# Patient Record
Sex: Female | Born: 1985 | Race: White | Hispanic: Yes | Smoking: Never smoker
Health system: Southern US, Community
[De-identification: ages and names within clinical notes are randomized; demographics above are authoritative.]

## PROBLEM LIST (undated history)

## (undated) DIAGNOSIS — G43909 Migraine, unspecified, not intractable, without status migrainosus: Secondary | ICD-10-CM

## (undated) DIAGNOSIS — R635 Abnormal weight gain: Secondary | ICD-10-CM

## (undated) HISTORY — PX: OTHER SURGICAL HISTORY: SHX169

## (undated) HISTORY — DX: Abnormal weight gain: R63.5

## (undated) HISTORY — DX: Migraine, unspecified, not intractable, without status migrainosus: G43.909

## (undated) HISTORY — PX: NO PAST SURGERIES: SHX2092

---

## 2012-09-25 ENCOUNTER — Ambulatory Visit: Payer: Self-pay | Admitting: Family Medicine

## 2012-12-18 ENCOUNTER — Ambulatory Visit: Payer: Self-pay | Admitting: Advanced Practice Midwife

## 2013-05-11 ENCOUNTER — Inpatient Hospital Stay: Payer: Self-pay | Admitting: Obstetrics and Gynecology

## 2013-05-11 LAB — CBC WITH DIFFERENTIAL/PLATELET
BASOS PCT: 0.4 %
Basophil #: 0.1 10*3/uL (ref 0.0–0.1)
Eosinophil #: 0.2 10*3/uL (ref 0.0–0.7)
Eosinophil %: 1.5 %
HCT: 36.3 % (ref 35.0–47.0)
HGB: 12.1 g/dL (ref 12.0–16.0)
Lymphocyte #: 2.9 10*3/uL (ref 1.0–3.6)
Lymphocyte %: 24.8 %
MCH: 30.1 pg (ref 26.0–34.0)
MCHC: 33.3 g/dL (ref 32.0–36.0)
MCV: 90 fL (ref 80–100)
MONO ABS: 1.3 x10 3/mm — AB (ref 0.2–0.9)
MONOS PCT: 10.7 %
Neutrophil #: 7.3 10*3/uL — ABNORMAL HIGH (ref 1.4–6.5)
Neutrophil %: 62.6 %
Platelet: 217 10*3/uL (ref 150–440)
RBC: 4.03 10*6/uL (ref 3.80–5.20)
RDW: 13.4 % (ref 11.5–14.5)
WBC: 11.7 10*3/uL — AB (ref 3.6–11.0)

## 2013-05-14 LAB — HEMATOCRIT: HCT: 27.8 % — AB (ref 35.0–47.0)

## 2014-05-17 NOTE — H&P (Signed)
L&D Evaluation:  History:  HPI 29 yo G1P0 at 40.[redacted] weeks gestation presents for Decreased FM.   Presents with decreased fetal movement   Patient's Medical History No Chronic Illness  H/.O Chlamydia, treated; H/O UTI, treated   Patient's Surgical History none   Medications Pre Natal Vitamins   Allergies NKDA   Social History none   Family History Non-Contributory   ROS:  ROS All systems were reviewed.  HEENT, CNS, GI, GU, Respiratory, CV, Renal and Musculoskeletal systems were found to be normal., except decreased FM   Exam:  Vital Signs stable   Urine Protein not completed   General no apparent distress   Mental Status clear   Heart normal sinus rhythm   Abdomen gravid, non-tender   Estimated Fetal Weight Average for gestational age, 7#4   Fetal Position VTX   Back no CVAT   Edema no edema   Pelvic no external lesions, FT/25/-3/vtx/soft   Mebranes Intact   FHT normal rate with no decels   FHT Description Reactive NST   Skin dry   Lymph no lymphadenopathy   Other O + ATB-/NR/RI/HB-/HIV-/GBS-/Glucola 137/ 3Hr GTT Normal   Impression:  Impression Reactive NST   Plan:  Plan discharge, IOL 05/12/2013   Electronic Signatures: Tyquarius Paglia, Prentice DockerMartin A (MD)  (Signed 05-May-15 14:17)  Authored: L&D Evaluation   Last Updated: 05-May-15 14:17 by Jaidyn Kuhl, Prentice DockerMartin A (MD)

## 2014-11-04 IMAGING — US US OB US >=[ID] SNGL FETUS
1 series · 13 of 28 positions shown · non-contrast
Comparison: none

CLINICAL DATA: Low Risk at anatomic scan.

EXAM:
ULTRASOUND OB >=L5HGX SINGLE FETUS

[Series 1: us ob us >=(id) sngl fetus · 0.26mm/px · 13 of 72 slices shown]
[im 3/72]
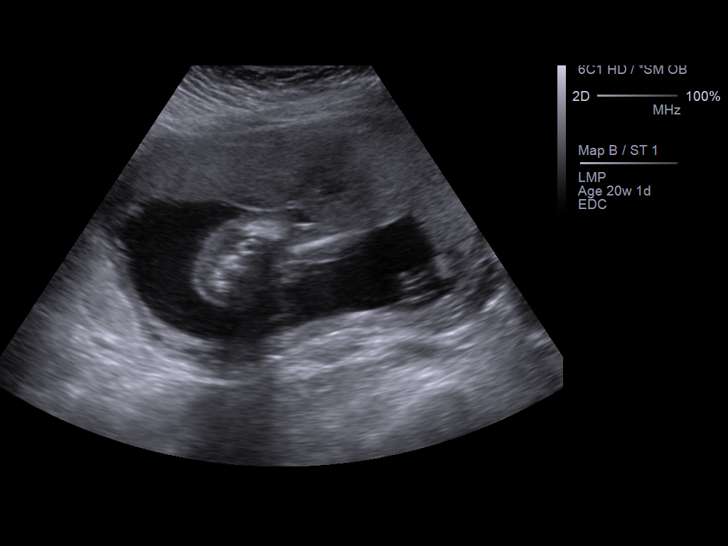
[im 8/72]
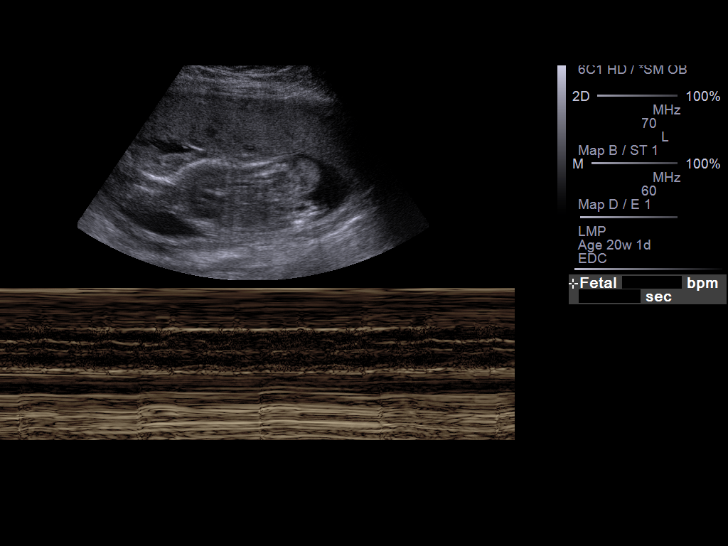
[im 14/72]
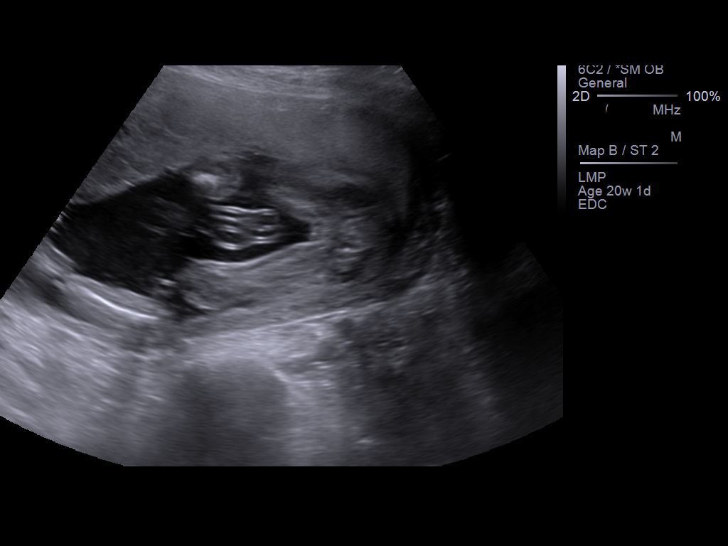
[im 19/72]
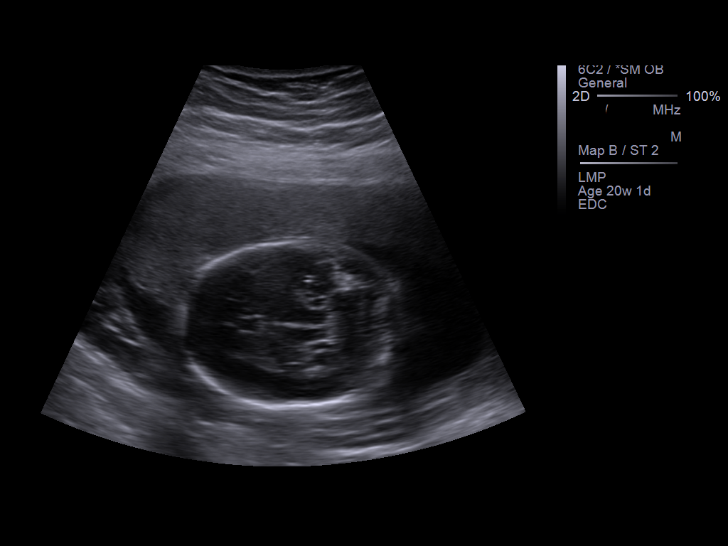
[im 24/72]
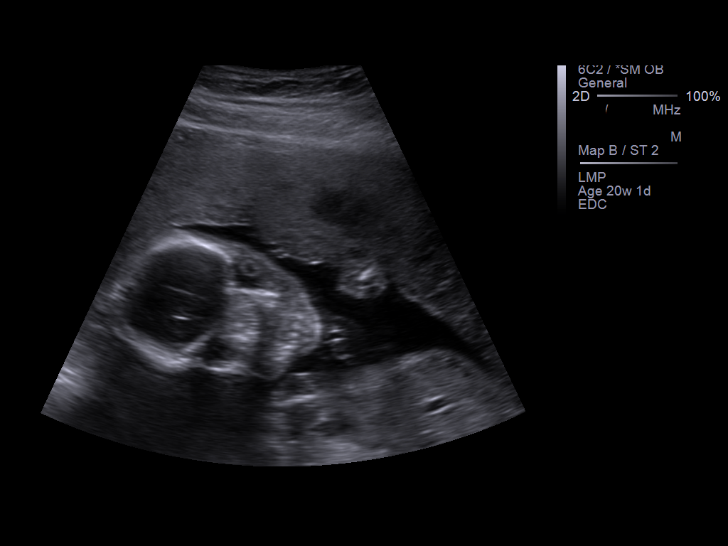
[im 29/72]
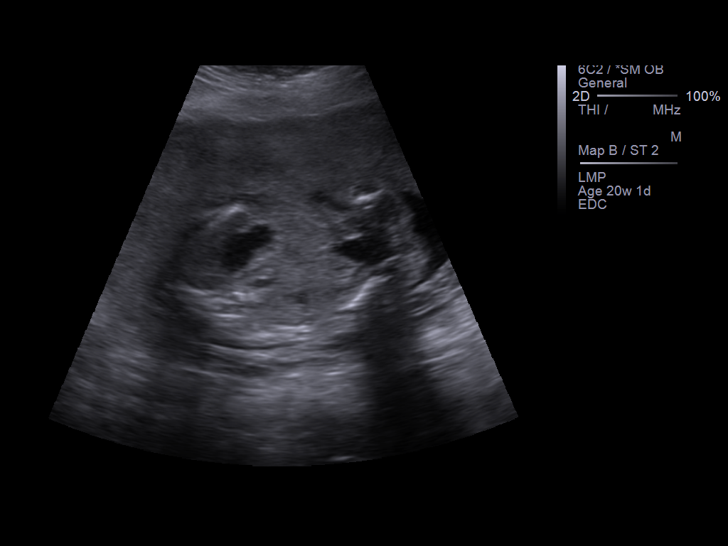
[im 37/72]
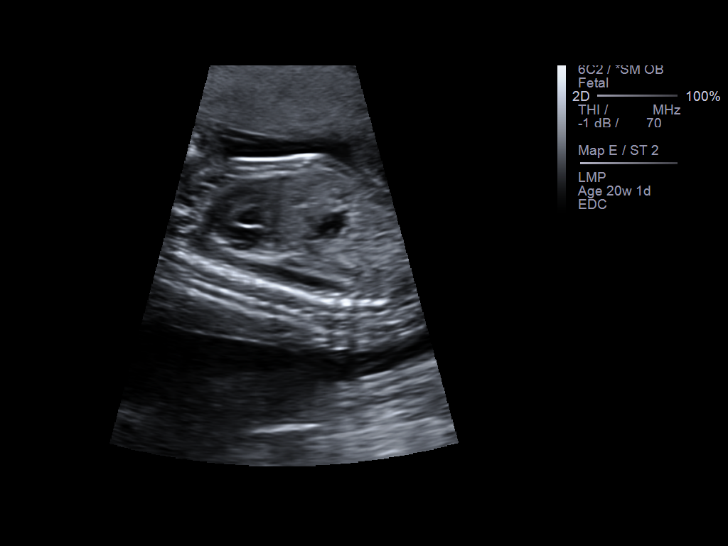
[im 43/72]
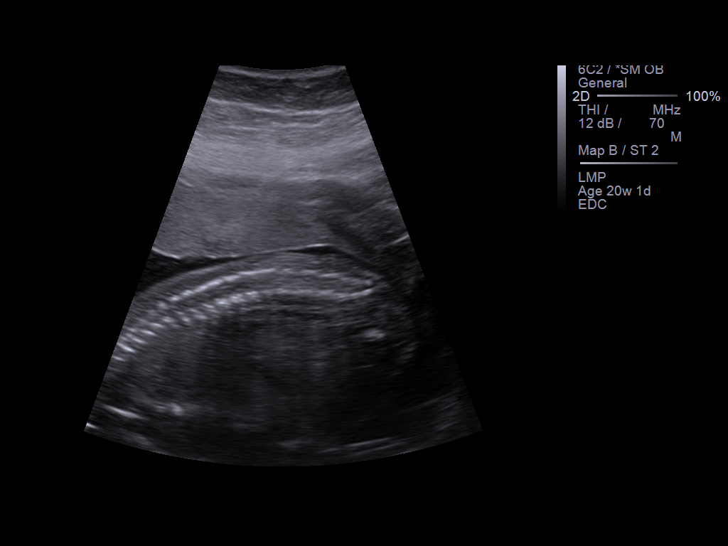
[im 48/72]
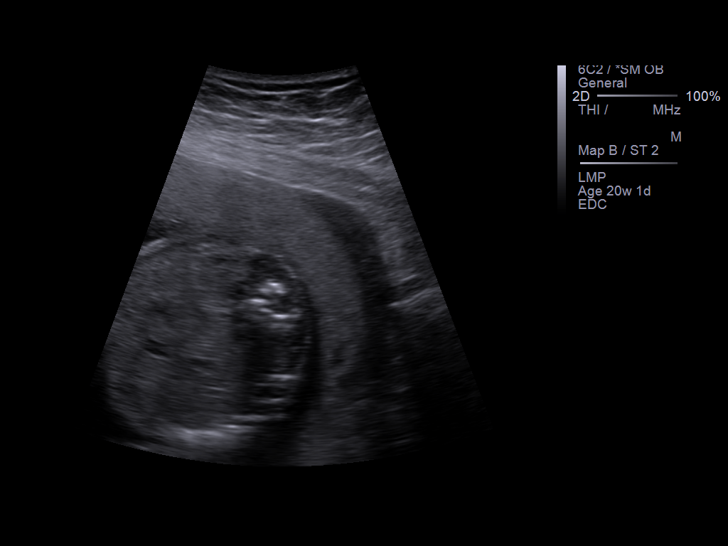
[im 53/72]
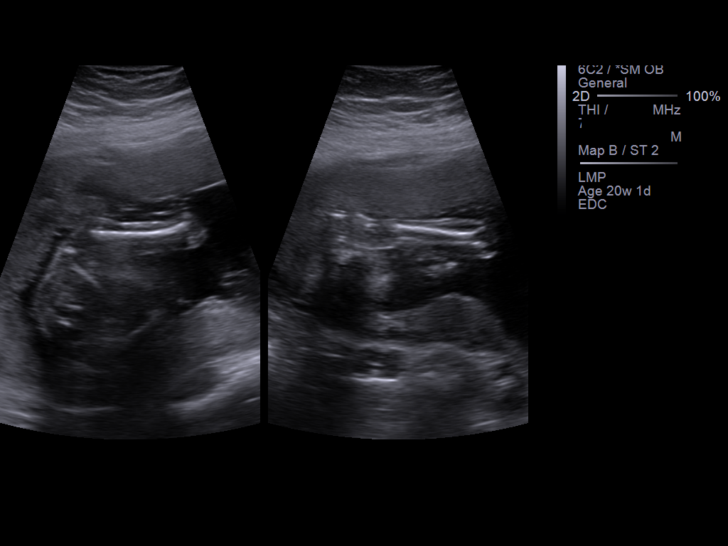
[im 58/72]
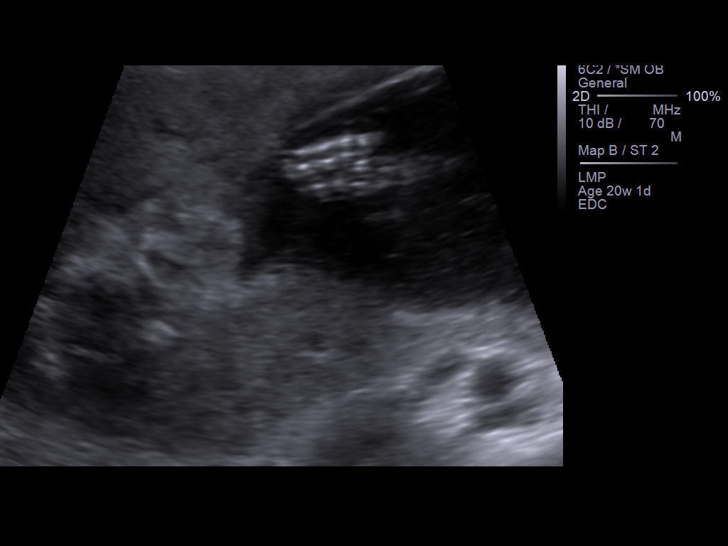
[im 64/72]
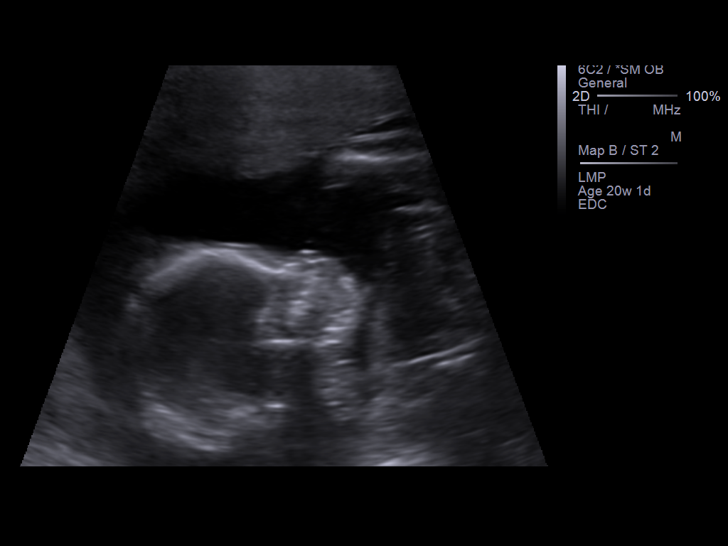
[im 69/72]
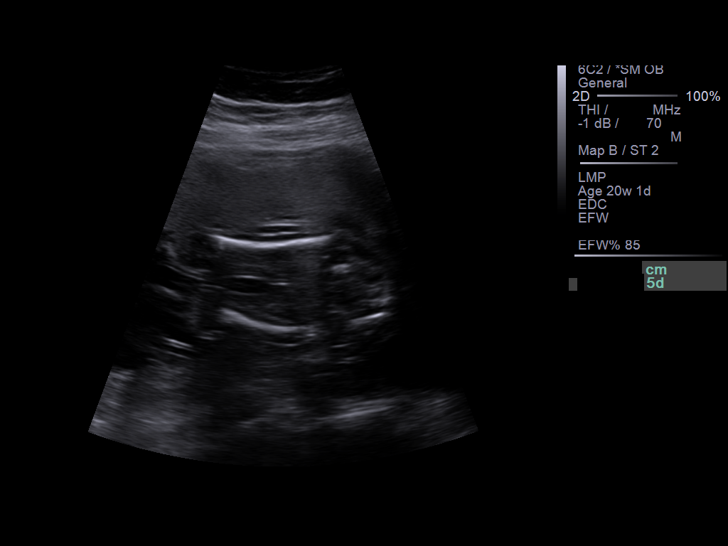

[13 of 28 positions shown; findings below may reference images not displayed]

FINDINGS: Number of Fetuses: 1

Heart Rate:  150 bpm

Movement: Present.

Presentation: Variable

Placental Location: Anterior and partially fundal

Previa: There is no evidence of placenta previa. The distance from
the lower placental segment to the internal cervical os is 4.7 cm

Amniotic Fluid (Subjective): Normal

FETAL BIOMETRY

BPD:  4.59cm 19.W   6d

HC:    17.74cm  20w   2d

AC:   16.13cm  21.W   2d

FL:   3.41cm  20.W   5d

Current Mean GA: 20w 4d                       US EDC: May 03, 2013

Assigned GA:  20w 1d              Assigned EDC:  May 06, 2013

FETAL ANATOMY

Lateral Ventricles: Normal.

Thalami/CSP: Visualized

Posterior Fossa:   Normal

Spine:  Normal

4 Chamber Heart on Left: Gas

Stomach on Left: Yes

3 Vessel Cord: Normal

Cord Insertion site: Normal

Kidneys:  Yes

Bladder:  Yes

Extremities:  Normal where visualized

Maternal Findings:

Cervix:  Closed  cm
IMPRESSION: There is a single viable IUP with variable presentation with
estimated gestational age of 20 weeks 4 days. The estimated date of
confinement is May 03, 2013 which is in reasonable remote with the
patient's clinical dating. No fetal anomalies were demonstrated.

## 2015-07-31 LAB — HM PAP SMEAR: HM Pap smear: NEGATIVE

## 2017-08-04 LAB — HM HIV SCREENING LAB: HM HIV Screening: NEGATIVE

## 2019-09-21 ENCOUNTER — Encounter: Payer: Self-pay | Admitting: Advanced Practice Midwife

## 2019-09-21 ENCOUNTER — Other Ambulatory Visit: Payer: Self-pay

## 2019-09-21 ENCOUNTER — Ambulatory Visit (LOCAL_COMMUNITY_HEALTH_CENTER): Payer: Self-pay | Admitting: Advanced Practice Midwife

## 2019-09-21 VITALS — BP 103/61 | HR 70 | Temp 97.1°F | Ht 61.0 in | Wt 162.0 lb

## 2019-09-21 DIAGNOSIS — E282 Polycystic ovarian syndrome: Secondary | ICD-10-CM

## 2019-09-21 DIAGNOSIS — Z30011 Encounter for initial prescription of contraceptive pills: Secondary | ICD-10-CM

## 2019-09-21 DIAGNOSIS — E669 Obesity, unspecified: Secondary | ICD-10-CM

## 2019-09-21 DIAGNOSIS — Z3009 Encounter for other general counseling and advice on contraception: Secondary | ICD-10-CM

## 2019-09-21 LAB — WET PREP FOR TRICH, YEAST, CLUE
Trichomonas Exam: NEGATIVE
Yeast Exam: NEGATIVE

## 2019-09-21 LAB — PREGNANCY, URINE: Preg Test, Ur: NEGATIVE

## 2019-09-21 MED ORDER — NORGESTIM-ETH ESTRAD TRIPHASIC 0.18/0.215/0.25 MG-25 MCG PO TABS: 1.0000 | ORAL_TABLET | Freq: Every day | ORAL | 13 refills | Status: DC

## 2019-09-21 NOTE — Progress Notes (Signed)
Only three packs of Tri-Lo-Sprintec dispensed as all available in [facility] (e-mail sent to S. Walker RPh regarding supply). Client counseled to call for Nurse Clinic appt for additional pills when last pack of ocps opened. UPT negative and wet mount negative. Jossie Ng, RN

## 2019-09-21 NOTE — Progress Notes (Signed)
[facility] [address] Main Number: [phone]    Family Planning Visit- Initial Visit  Subjective:  Aanshi Batchelder is a 34 y.o. MHF G1P1000 (34 yo daughter) nonsmoker  being seen today for an initial well woman visit and to discuss family planning options.  She is currently using None for pregnancy prevention. Patient reports she does not want a pregnancy in the next year.  Patient has the following medical conditions does not have a problem list on file.  Chief Complaint  Patient presents with  . Annual Exam    Patient reports LMP 09/11/19.  Last sex 09/18/19 without condom; with current partner x 8 years; one sex partner in last 3 mo.  Unemployed.  Living with husband and child.  Last ETOH 09/11/19 (2 mixed drinks) on special occassions. Last pap 07/31/15 neg no HPV done.  Patient denies cigs, MJ.     Body mass index is 30.61 kg/m. - Patient is eligible for diabetes screening based on BMI and age >47?  not applicable HA1C ordered? not applicable  Patient reports 1 of partners in last year. Desires STI screening?  Yes  Has patient been screened once for HCV in the past?  No  No results found for: HCVAB  Does the patient have current drug use (including MJ), have a partner with drug use, and/or has been incarcerated since last result? No  If yes-- Screen for HCV through [facility]   Does the patient meet criteria for HBV testing? No  Criteria:  -Household, sexual or needle sharing contact with HBV -History of drug use -HIV positive -Those with known Hep C   Health Maintenance Due  Topic Date Due  . Hepatitis C Screening  Never done  . TETANUS/TDAP  Never done  . PAP SMEAR-Modifier  07/31/2018  . INFLUENZA VACCINE  Never done    Review of Systems  Constitutional: Positive for weight loss (wt gain but states has lost 20 lbs since 07/2019).  Neurological: Positive for headaches (1-3x/wk always occipital and  frontal relieved with Tylenol or ASA, -N&V,,-vision,-audio,-neuro).  All other systems reviewed and are negative.   The following portions of the patient's history were reviewed and updated as appropriate: allergies, current medications, past family history, past medical history, past social history, past surgical history and problem list. Problem list updated.   See flowsheet for other program required questions.  Objective:   Vitals:   09/21/19 0848  BP: 103/61  Pulse: 70  Temp: (!) 97.1 F (36.2 C)  TempSrc: Oral  Weight: 162 lb (73.5 kg)  Height: 5\' 1"  (1.549 m)    Physical Exam Constitutional:      Appearance: Normal appearance. She is obese.  HENT:     Head: Normocephalic and atraumatic.     Mouth/Throat:     Mouth: Mucous membranes are moist.  Eyes:     Conjunctiva/sclera: Conjunctivae normal.  Cardiovascular:     Rate and Rhythm: Normal rate and regular rhythm.  Pulmonary:     Effort: Pulmonary effort is normal.     Breath sounds: Normal breath sounds.  Chest:     Breasts:        Right: Normal.        Left: Normal.  Abdominal:     Palpations: Abdomen is soft.     Comments: Poor tone, soft without tenderness, increased adipose  Genitourinary:    General: Normal vulva.     Exam position: Lithotomy position.  Vagina: Vaginal discharge (clear leukorrhea, ph<4.5) present.     Cervix: Normal.     Uterus: Normal.      Adnexa: Right adnexa normal and left adnexa normal.     Rectum: Normal.     Comments: Pap done Musculoskeletal:        General: Normal range of motion.     Cervical back: Normal range of motion and neck supple.  Skin:    General: Skin is warm and dry.  Neurological:     Mental Status: She is alert.  Psychiatric:        Mood and Affect: Mood normal.       Assessment and Plan:  Susannah Carbin is a 34 y.o. female presenting to the [facility] Department for an initial well woman exam/family planning visit  Contraception  counseling: Reviewed all forms of birth control options in the tiered based approach. available including abstinence; over the counter/barrier methods; hormonal contraceptive medication including pill, patch, ring, injection,contraceptive implant, ECP; hormonal and nonhormonal IUDs; permanent sterilization options including vasectomy and the various tubal sterilization modalities. Risks, benefits, and typical effectiveness rates were reviewed.  Questions were answered.  Written information was also given to the patient to review.  Patient desires ocp's, this was prescribed for patient. She will follow up in 1 year for surveillance.  She was told to call with any further questions, or with any concerns about this method of contraception.  Emphasized use of condoms 100% of the time for STI prevention.  Patient was offered ECP. ECP was not accepted by the patient. ECP counseling was not given - see RN documentation  1. Family planning  - Pregnancy, urine - WET PREP FOR TRICH, YEAST, CLUE - HIV [place] [facility] - Syphilis Serology, [place] [facility] - Chlamydia/Gonorrhea  Lab - IGP, Aptima HPV - Norgestimate-Ethinyl Estradiol Triphasic (TRI-LO-SPRINTEC) 0.18/0.215/0.25 MG-25 MCG tab; Take 1 tablet by mouth daily.  Dispense: 28 tablet; Refill: 13  2. Encounter for initial prescription of contraceptive pills OTC-Lo #13 I po daily to begin today Pt counseled to abstain/back up condoms only next 7 days Pt counseled to do PT 10/02/19 and call if + and stop ocp's if +     No follow-ups on file.  No future appointments.  Alberteen Spindle, CNM

## 2019-09-25 LAB — IGP, APTIMA HPV
HPV Aptima: NEGATIVE
PAP Smear Comment: 0

## 2019-12-27 ENCOUNTER — Ambulatory Visit (LOCAL_COMMUNITY_HEALTH_CENTER): Payer: Self-pay

## 2019-12-27 ENCOUNTER — Other Ambulatory Visit: Payer: Self-pay

## 2019-12-27 VITALS — BP 109/68 | Ht 61.0 in | Wt 159.5 lb

## 2019-12-27 DIAGNOSIS — Z3202 Encounter for pregnancy test, result negative: Secondary | ICD-10-CM

## 2019-12-27 LAB — PREGNANCY, URINE: Preg Test, Ur: NEGATIVE

## 2019-12-27 NOTE — Progress Notes (Signed)
UPT Negative today. Reports not using any bc method and trying to become preg. LMP 12/10/2019. Advised to return for preg test if misses next period and to schedule early in morning in order to use first urine of the day. Questions answered and reports understanding. M. Yemen, interpreter today. Jerel Shepherd, RN

## 2020-04-20 LAB — HM PAP SMEAR: HM Pap smear: NEGATIVE

## 2021-05-08 ENCOUNTER — Ambulatory Visit (LOCAL_COMMUNITY_HEALTH_CENTER): Payer: Self-pay

## 2021-05-08 VITALS — BP 121/76 | Ht 60.75 in | Wt 160.5 lb

## 2021-05-08 DIAGNOSIS — Z3201 Encounter for pregnancy test, result positive: Secondary | ICD-10-CM

## 2021-05-08 LAB — PREGNANCY, URINE: Preg Test, Ur: POSITIVE — AB

## 2021-05-08 MED ORDER — PRENATAL VITAMINS 28-0.8 MG PO TABS: 28.0000 mg | ORAL_TABLET | Freq: Every day | ORAL | 0 refills | Status: AC

## 2021-05-08 NOTE — Progress Notes (Signed)
UPT positive. Gave resource list of local prenatal providers and pt said she would like to receive care at [facility].   ? ?Pregnancy information packet given and reviewed with her.  Prenatal vitamins given. ?Walked pt to clerk for preadmit. ? ?Language line interpreter Marthenia Rolling [phone] used for today's visit.  ?Cherlynn Polo, RN  ? ?

## 2021-06-12 ENCOUNTER — Ambulatory Visit: Payer: Medicaid Other | Admitting: Advanced Practice Midwife

## 2021-06-12 ENCOUNTER — Encounter: Payer: Self-pay | Admitting: Advanced Practice Midwife

## 2021-06-12 DIAGNOSIS — O99211 Obesity complicating pregnancy, first trimester: Secondary | ICD-10-CM

## 2021-06-12 DIAGNOSIS — R7309 Other abnormal glucose: Secondary | ICD-10-CM | POA: Insufficient documentation

## 2021-06-12 DIAGNOSIS — Z8759 Personal history of other complications of pregnancy, childbirth and the puerperium: Secondary | ICD-10-CM | POA: Diagnosis not present

## 2021-06-12 DIAGNOSIS — Z0281 Encounter for paternity testing: Secondary | ICD-10-CM | POA: Insufficient documentation

## 2021-06-12 DIAGNOSIS — O99210 Obesity complicating pregnancy, unspecified trimester: Secondary | ICD-10-CM | POA: Insufficient documentation

## 2021-06-12 DIAGNOSIS — O09521 Supervision of elderly multigravida, first trimester: Secondary | ICD-10-CM

## 2021-06-12 DIAGNOSIS — O09529 Supervision of elderly multigravida, unspecified trimester: Secondary | ICD-10-CM | POA: Insufficient documentation

## 2021-06-12 HISTORY — DX: Encounter for paternity testing: Z02.81

## 2021-06-12 LAB — URINALYSIS
Bilirubin, UA: NEGATIVE
Ketones, UA: NEGATIVE
Leukocytes,UA: NEGATIVE
Nitrite, UA: NEGATIVE
Protein,UA: NEGATIVE
RBC, UA: NEGATIVE
Specific Gravity, UA: 1.01 (ref 1.005–1.030)
Urobilinogen, Ur: 0.2 mg/dL (ref 0.2–1.0)
pH, UA: 6.5 (ref 5.0–7.5)

## 2021-06-12 LAB — HEMOGLOBIN, FINGERSTICK: Hemoglobin: 12.9 g/dL (ref 11.1–15.9)

## 2021-06-12 LAB — WET PREP FOR TRICH, YEAST, CLUE
Trichomonas Exam: NEGATIVE
Yeast Exam: NEGATIVE

## 2021-06-12 NOTE — Progress Notes (Signed)
Here today for 11.3 week MH IP. Taking PNV QD. Denies ED/hospital visits since +PT. 1hgtt today. Tawny Hopping, RN

## 2021-06-12 NOTE — Progress Notes (Signed)
[facility]   INITIAL PRENATAL VISIT NOTE  Subjective:  Lori Smith is a 36 y.o. SHF nonsmoker G2P1001 (14 yo daughter) at [redacted]w[redacted]d being seen today to start prenatal care at the [facility] Department. She feels "happy" about surprise pregnancy with no birth control. 36 yo FOB feels "good" about pregnancy he has a 27 yo daughter who doesn't live with him, they have been in an 8 year supportive relationship; "I think he's working".  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 LMP 03/24/21. Denies u/s or ER visits this pregnancy. Denies cigs, vaping, cigars, MJ. Last ETOH 02/20/21 (3 Margaritas) q 1-2 mo. Last dental exam 2021.  Last pap 09/21/19 neg HPV neg.  Hx PROM with chorio, maternal exhaustion so forceps delivery with 3rd degree laceration and pp hemorrhage.   She is currently monitored for the following issues for this high-risk pregnancy and has  Polycystic ovarian disease dx'd age 48; Obesity affecting pregnancy BMI=30.9; Advanced maternal age in multigravida 35 yo; Supervision of high-risk pregnancy of elderly multigravida, first trimester; Advanced paternal age 51; and History of pp hemorrhage EBL: 500 cc 05/13/13 on their problem list.  Patient reports no complaints.  Contractions: Not present. Vag. Bleeding: None.  Movement: Absent. Denies leaking of fluid.   Indications for ASA therapy (per uptodate) One of the following: Previous pregnancy with preeclampsia, especially early onset and with an adverse outcome No Multifetal gestation No Chronic hypertension No Type 1 or 2 diabetes mellitus No Chronic kidney disease No Autoimmune disease (antiphospholipid syndrome, systemic lupus erythematosus) No  Two or more of the following: Nulliparity No Obesity (body mass index >30 kg/m2) Yes Family history of preeclampsia in mother or sister No Age ?36 years Yes Sociodemographic characteristics (African  American race, low socioeconomic level) No Personal risk factors (eg, previous pregnancy with low birth weight or small for gestational age infant, previous adverse pregnancy outcome [eg, stillbirth], interval >10 years between pregnancies) No   The following portions of the patient's history were reviewed and updated as appropriate: allergies, current medications, past family history, past medical history, past social history, past surgical history and problem list. Problem list updated.  Objective:   Vitals:   06/12/21 0841  BP: 118/71  Pulse: 84  Temp: 98 F (36.7 C)  Weight: 162 lb 9.6 oz (73.8 kg)    Fetal Status: Fetal Heart Rate (bpm): 160 Fundal Height: 11 cm Movement: Absent  Presentation: Undeterminable   Physical Exam Vitals and nursing note reviewed.  Constitutional:      General: She is not in acute distress.    Appearance: Normal appearance. She is well-developed. She is obese.  HENT:     Head: Normocephalic and atraumatic.     Right Ear: External ear normal.     Left Ear: External ear normal.     Nose: Nose normal. No congestion or rhinorrhea.     Mouth/Throat:     Lips: Pink.     Mouth: Mucous membranes are moist.     Dentition: Normal dentition. No dental caries.     Pharynx: Oropharynx is clear. Uvula midline.     Comments: Dentition: fair; last dental exam 2021 Eyes:     General: No scleral icterus.    Conjunctiva/sclera: Conjunctivae normal.  Neck:     Thyroid: No thyroid mass, thyromegaly or thyroid tenderness.  Cardiovascular:     Rate and Rhythm: Normal rate.     Pulses:  Normal pulses.     Comments: Extremities are warm and well perfused Pulmonary:     Effort: Pulmonary effort is normal.     Breath sounds: Normal breath sounds.  Chest:     Chest wall: No mass.  Breasts:    Tanner Score is 5.     Breasts are symmetrical.     Right: Normal. No mass, nipple discharge or skin change.     Left: Normal. No mass, nipple discharge or skin change.   Abdominal:     Palpations: Abdomen is soft.     Tenderness: There is no abdominal tenderness.     Comments: Gravid, soft without masses or tenderness, increased obesity, FHR=160, FH=10-11 wks size  Genitourinary:    General: Normal vulva.     Exam position: Lithotomy position.     Pubic Area: No rash.      Labia:        Right: No rash.        Left: No rash.      Vagina: Vaginal discharge (white thick curdy leukorrhea, ph<4.5) present.     Cervix: Normal.     Uterus: Normal. Enlarged (Gravid 10-11 wks size). Not tender.      Rectum: Normal. No external hemorrhoid.  Musculoskeletal:     Right lower leg: No edema.     Left lower leg: No edema.  Lymphadenopathy:     Cervical: No cervical adenopathy.     Upper Body:     Right upper body: No axillary adenopathy.     Left upper body: No axillary adenopathy.  Skin:    General: Skin is warm.     Capillary Refill: Capillary refill takes less than 2 seconds.  Neurological:     Mental Status: She is alert.    Assessment and Plan:  Pregnancy: G2P1001 at [redacted]w[redacted]d  1. Obesity affecting pregnancy, antepartum Counseled on weight gain of 11-20 lbs this pregnancy Agrees to ASA 81 mg daily to begin at 12 wks  2. Antepartum multigravida of advanced maternal age 63 yo Accepts genetic counseling apt and NIPS at that apt   3. Supervision of high-risk pregnancy of elderly multigravida, first trimester MFM u/s ordered for dating Please give pt dental list and encourage dental apt asap  - Glucose, 1 hour gestational - Hgb A1c w/o eAG - Comprehensive metabolic panel - Prenatal profile without Varicella/Rubella [id number]) - Protein / creatinine ratio, urine  (Spot) - TSH - Urine Culture - Chlamydia/GC NAA, Confirmation - HCV Ab w Reflex to Quant PCR - HIV-1/HIV-2 Qualitative RNA [id number] Drug Screen - WET PREP FOR TRICH, YEAST, CLUE - Urinalysis (Urine Dip) - Hemoglobin, venipuncture  4. Advanced paternal age 62 Accepts genetic  counseling apt  5. History of pp hemorrhage EBL: 500 cc 05/13/13     Discussed overview of care and coordination with inpatient delivery practices including [facility], [facility], [facility] and [facility].   Reviewed Centering pregnancy as standard of care at [facility]   Preterm labor symptoms and general obstetric precautions including but not limited to vaginal bleeding, contractions, leaking of fluid and fetal movement were reviewed in detail with the patient.  Please refer to After Visit Summary for other counseling recommendations.   No follow-ups on file.  No future appointments.  Herbie Saxon, CNM

## 2021-06-12 NOTE — Progress Notes (Signed)
Urine dip, hgb, and wet mount reviewed during clinic visit - no treatment indicated.   Earlyne Iba, RN

## 2021-06-13 ENCOUNTER — Other Ambulatory Visit: Payer: Self-pay | Admitting: Advanced Practice Midwife

## 2021-06-13 DIAGNOSIS — O09521 Supervision of elderly multigravida, first trimester: Secondary | ICD-10-CM

## 2021-06-14 ENCOUNTER — Telehealth: Payer: Self-pay

## 2021-06-14 LAB — URINALYSIS
Bilirubin, UA: NEGATIVE
Ketones, UA: NEGATIVE
Leukocytes,UA: NEGATIVE
Nitrite, UA: NEGATIVE
Protein,UA: NEGATIVE
RBC, UA: NEGATIVE
Specific Gravity, UA: 1.008 (ref 1.005–1.030)
Urobilinogen, Ur: 0.2 mg/dL (ref 0.2–1.0)
pH, UA: 6.5 (ref 5.0–7.5)

## 2021-06-14 LAB — CBC/D/PLT+RPR+RH+ABO+AB SCR
Antibody Screen: NEGATIVE
Basophils Absolute: 0 10*3/uL (ref 0.0–0.2)
Basos: 0 %
EOS (ABSOLUTE): 0.2 10*3/uL (ref 0.0–0.4)
Eos: 2 %
Hematocrit: 39.4 % (ref 34.0–46.6)
Hemoglobin: 13.1 g/dL (ref 11.1–15.9)
Hepatitis B Surface Ag: NEGATIVE
Immature Grans (Abs): 0 10*3/uL (ref 0.0–0.1)
Immature Granulocytes: 0 %
Lymphocytes Absolute: 2.5 10*3/uL (ref 0.7–3.1)
Lymphs: 22 %
MCH: 29.6 pg (ref 26.6–33.0)
MCHC: 33.2 g/dL (ref 31.5–35.7)
MCV: 89 fL (ref 79–97)
Monocytes Absolute: 0.8 10*3/uL (ref 0.1–0.9)
Monocytes: 7 %
Neutrophils Absolute: 7.7 10*3/uL — ABNORMAL HIGH (ref 1.4–7.0)
Neutrophils: 69 %
Platelets: 316 10*3/uL (ref 150–450)
RBC: 4.43 x10E6/uL (ref 3.77–5.28)
RDW: 12.9 % (ref 11.7–15.4)
RPR Ser Ql: NONREACTIVE
Rh Factor: POSITIVE
WBC: 11.3 10*3/uL — ABNORMAL HIGH (ref 3.4–10.8)

## 2021-06-14 LAB — 789231 7+OXYCODONE-BUND
Amphetamines, Urine: NEGATIVE ng/mL
BENZODIAZ UR QL: NEGATIVE ng/mL
Barbiturate screen, urine: NEGATIVE ng/mL
Cannabinoid Quant, Ur: NEGATIVE ng/mL
Cocaine (Metab.): NEGATIVE ng/mL
OPIATE SCREEN URINE: NEGATIVE ng/mL
Oxycodone/Oxymorphone, Urine: NEGATIVE ng/mL
PCP Quant, Ur: NEGATIVE ng/mL

## 2021-06-14 LAB — HCV INTERPRETATION

## 2021-06-14 LAB — COMPREHENSIVE METABOLIC PANEL
ALT: 16 IU/L (ref 0–32)
AST: 14 IU/L (ref 0–40)
Albumin/Globulin Ratio: 1.3 (ref 1.2–2.2)
Albumin: 4.1 g/dL (ref 3.8–4.8)
Alkaline Phosphatase: 85 IU/L (ref 44–121)
BUN/Creatinine Ratio: 14 (ref 9–23)
BUN: 7 mg/dL (ref 6–20)
Bilirubin Total: 0.3 mg/dL (ref 0.0–1.2)
CO2: 19 mmol/L — ABNORMAL LOW (ref 20–29)
Calcium: 9.1 mg/dL (ref 8.7–10.2)
Chloride: 102 mmol/L (ref 96–106)
Creatinine, Ser: 0.5 mg/dL — ABNORMAL LOW (ref 0.57–1.00)
Globulin, Total: 3.1 g/dL (ref 1.5–4.5)
Glucose: 144 mg/dL — ABNORMAL HIGH (ref 70–99)
Potassium: 4 mmol/L (ref 3.5–5.2)
Sodium: 136 mmol/L (ref 134–144)
Total Protein: 7.2 g/dL (ref 6.0–8.5)
eGFR: 125 mL/min/{1.73_m2} (ref >59)

## 2021-06-14 LAB — CHLAMYDIA/GC NAA, CONFIRMATION
Chlamydia trachomatis, NAA: NEGATIVE
Neisseria gonorrhoeae, NAA: NEGATIVE

## 2021-06-14 LAB — GLUCOSE, 1 HOUR GESTATIONAL: Gestational Diabetes Screen: 140 mg/dL — ABNORMAL HIGH (ref 70–139)

## 2021-06-14 LAB — HCV AB W REFLEX TO QUANT PCR: HCV Ab: NONREACTIVE

## 2021-06-14 LAB — HIV-1/HIV-2 QUALITATIVE RNA
HIV-1 RNA, Qualitative: NONREACTIVE
HIV-2 RNA, Qualitative: NONREACTIVE

## 2021-06-14 LAB — URINE CULTURE: Organism ID, Bacteria: NO GROWTH

## 2021-06-14 LAB — HGB A1C W/O EAG: Hgb A1c MFr Bld: 5.6 % (ref 4.8–5.6)

## 2021-06-14 LAB — PROTEIN / CREATININE RATIO, URINE
Creatinine, Urine: 24.6 mg/dL
Protein, Ur: <4 mg/dL

## 2021-06-14 LAB — TSH: TSH: 0.689 u[IU]/mL (ref 0.450–4.500)

## 2021-06-14 NOTE — Telephone Encounter (Signed)
Telephone call to patient regarding her abnormal 1 HR GTT results and the need for a 3 HR GTT.  Appointment scheduled for 06-29-21 at 8:35 am (arrival time is 8:20).  Language Line used today.  Nadir / [id number].  Hart Carwin, RN

## 2021-06-15 ENCOUNTER — Telehealth: Payer: Self-pay

## 2021-06-15 NOTE — Telephone Encounter (Signed)
Contacted and spoke with patient regarding ultrasound date scheduled for July 25 th at 0800 am at [facility].   Al Decant, RN

## 2021-06-20 ENCOUNTER — Telehealth: Payer: Self-pay

## 2021-06-20 ENCOUNTER — Emergency Department: Payer: Self-pay

## 2021-06-20 ENCOUNTER — Emergency Department
Admission: EM | Admit: 2021-06-20 | Discharge: 2021-06-20 | Disposition: A | Discharge status: Home / Self Care | Payer: Self-pay | Attending: Emergency Medicine | Admitting: Emergency Medicine

## 2021-06-20 ENCOUNTER — Other Ambulatory Visit: Payer: Self-pay

## 2021-06-20 DIAGNOSIS — R81 Glycosuria: Secondary | ICD-10-CM | POA: Insufficient documentation

## 2021-06-20 DIAGNOSIS — O4690 Antepartum hemorrhage, unspecified, unspecified trimester: Secondary | ICD-10-CM

## 2021-06-20 DIAGNOSIS — E876 Hypokalemia: Secondary | ICD-10-CM | POA: Insufficient documentation

## 2021-06-20 DIAGNOSIS — R8271 Bacteriuria: Secondary | ICD-10-CM | POA: Insufficient documentation

## 2021-06-20 DIAGNOSIS — O209 Hemorrhage in early pregnancy, unspecified: Secondary | ICD-10-CM | POA: Insufficient documentation

## 2021-06-20 DIAGNOSIS — Z3A13 13 weeks gestation of pregnancy: Secondary | ICD-10-CM | POA: Insufficient documentation

## 2021-06-20 DIAGNOSIS — N9489 Other specified conditions associated with female genital organs and menstrual cycle: Secondary | ICD-10-CM | POA: Insufficient documentation

## 2021-06-20 LAB — CBC
HCT: 37 % (ref 36.0–46.0)
Hemoglobin: 12.3 g/dL (ref 12.0–15.0)
MCH: 29.1 pg (ref 26.0–34.0)
MCHC: 33.2 g/dL (ref 30.0–36.0)
MCV: 87.7 fL (ref 80.0–100.0)
Platelets: 267 10*3/uL (ref 150–400)
RBC: 4.22 MIL/uL (ref 3.87–5.11)
RDW: 13.1 % (ref 11.5–15.5)
WBC: 12.4 10*3/uL — ABNORMAL HIGH (ref 4.0–10.5)
nRBC: 0 % (ref 0.0–0.2)

## 2021-06-20 LAB — CHLAMYDIA/NGC RT PCR (ARMC ONLY)
Chlamydia Tr: NOT DETECTED
N gonorrhoeae: NOT DETECTED

## 2021-06-20 LAB — POC URINE PREG, ED: Preg Test, Ur: POSITIVE — AB

## 2021-06-20 LAB — URINALYSIS, COMPLETE (UACMP) WITH MICROSCOPIC
Bilirubin Urine: NEGATIVE
Glucose, UA: 50 mg/dL — AB
Ketones, ur: NEGATIVE mg/dL
Nitrite: NEGATIVE
Protein, ur: NEGATIVE mg/dL
Specific Gravity, Urine: 1.004 — ABNORMAL LOW (ref 1.005–1.030)
pH: 6 (ref 5.0–8.0)

## 2021-06-20 LAB — WET PREP, GENITAL
Clue Cells Wet Prep HPF POC: NONE SEEN
Sperm: NONE SEEN
Trich, Wet Prep: NONE SEEN
WBC, Wet Prep HPF POC: <10 (ref <10)
Yeast Wet Prep HPF POC: NONE SEEN

## 2021-06-20 LAB — BASIC METABOLIC PANEL
Anion gap: 7 (ref 5–15)
BUN: 6 mg/dL (ref 6–20)
CO2: 19 mmol/L — ABNORMAL LOW (ref 22–32)
Calcium: 9.2 mg/dL (ref 8.9–10.3)
Chloride: 108 mmol/L (ref 98–111)
Creatinine, Ser: 0.33 mg/dL — ABNORMAL LOW (ref 0.44–1.00)
GFR, Estimated: >60 mL/min (ref >60)
Glucose, Bld: 154 mg/dL — ABNORMAL HIGH (ref 70–99)
Potassium: 3.3 mmol/L — ABNORMAL LOW (ref 3.5–5.1)
Sodium: 134 mmol/L — ABNORMAL LOW (ref 135–145)

## 2021-06-20 LAB — ABO/RH: ABO/RH(D): O POS

## 2021-06-20 LAB — HCG, QUANTITATIVE, PREGNANCY: hCG, Beta Chain, Quant, S: 150348 m[IU]/mL — ABNORMAL HIGH (ref <5)

## 2021-06-20 MED ORDER — POTASSIUM CHLORIDE CRYS ER 20 MEQ PO TBCR
40.0000 meq | EXTENDED_RELEASE_TABLET | Freq: Once | ORAL | Status: AC
Start: 2021-06-20 — End: 2021-06-20
  Administered 2021-06-20: 40 meq via ORAL
  Filled 2021-06-20: qty 2

## 2021-06-20 MED ORDER — ACETAMINOPHEN 500 MG PO TABS
1000.0000 mg | ORAL_TABLET | Freq: Once | ORAL | Status: AC
  Administered 2021-06-20: 1000 mg via ORAL
  Filled 2021-06-20: qty 2

## 2021-06-20 MED ORDER — CEPHALEXIN 500 MG PO CAPS: 500.0000 mg | ORAL_CAPSULE | Freq: Four times a day (QID) | ORAL | 0 refills | Status: AC

## 2021-06-20 NOTE — ED Provider Notes (Addendum)
[facility] Provider Note    Event Date/Time   First MD Initiated Contact with Patient 06/20/21 1105     (approximate)   History   Vaginal Bleeding   HPI  Lori Smith is a 36 y.o. female   G2P1001 receiving prenatal care for pregnancy approximately [redacted] weeks along presents for evaluation of some painless vaginal bleeding she noticed yesterday and today.  She does not recall any injuries or trauma.  She states she has had a little bit of white discharge present since she was last evaluated by her midwife on 6/6 but does not change at all and has not noticed any other discharge.  She states he had a little soreness in her right lower back area yesterday but is not any significant pain today.  She denies any abdominal pain, other back pain, headache area, sore throat, fevers or rash.  She states he had a slight cough for the last 1 to 2 days.  No shortness of breath.  She states she is only taking prenatals.  No other acute concerns at this time.     Physical Exam  Triage Vital Signs: ED Triage Vitals [06/20/21 0954]  Enc Vitals Group     BP 124/74     Pulse Rate 75     Resp 18     Temp 98.6 F (37 C)     Temp Source Oral     SpO2 94 %     Weight 161 lb (73 kg)     Height 5' 0.63" (1.54 m)     Head Circumference      Peak Flow      Pain Score 0     Pain Loc      Pain Edu?      Excl. in GC?     Most recent vital signs: Vitals:   06/20/21 0954  BP: 124/74  Pulse: 75  Resp: 18  Temp: 98.6 F (37 C)  SpO2: 94%    General: Awake, no distress.  CV:  Good peripheral perfusion.  2+ radial pulses. Resp:  Normal effort.  Clear bilaterally. Abd:  No distention.  Soft nontender throughout and gravid.  There is no overlying skin changes over the back.  No significant CVA tenderness. Other:  There is some scant dried blood in the pelvic vault with closed cervix noted and no active bleeding or other lesions noted.  There is no purulent drainage or  other significant abnormal discharge other some scant white appearing discharge.   ED Results / Procedures / Treatments  Labs (all labs ordered are listed, but only abnormal results are displayed) Labs Reviewed  CBC - Abnormal; Notable for the following components:      Result Value   WBC 12.4 (*)    All other components within normal limits  BASIC METABOLIC PANEL - Abnormal; Notable for the following components:   Sodium 134 (*)    Potassium 3.3 (*)    CO2 19 (*)    Glucose, Bld 154 (*)    Creatinine, Ser 0.33 (*)    All other components within normal limits  HCG, QUANTITATIVE, PREGNANCY - Abnormal; Notable for the following components:   hCG, Beta Chain, Quant, S 150,348 (*)    All other components within normal limits  URINALYSIS, COMPLETE (UACMP) WITH MICROSCOPIC - Abnormal; Notable for the following components:   Color, Urine STRAW (*)    APPearance CLEAR (*)    Specific Gravity, Urine 1.004 (*)  Glucose, UA 50 (*)    Hgb urine dipstick MODERATE (*)    Leukocytes,Ua TRACE (*)    Bacteria, UA RARE (*)    All other components within normal limits  POC URINE PREG, ED - Abnormal; Notable for the following components:   Preg Test, Ur POSITIVE (*)    All other components within normal limits  WET PREP, GENITAL  CHLAMYDIA/NGC RT PCR ([facility] ONLY)            ABO/RH     EKG   RADIOLOGY   Pelvic OB ultrasound interpreted myself shows a single IUP at 13 weeks with a heart rate of 153 without evidence of hemorrhage and no free fluid.  I also reviewed radiology's interpretation.   Renal ultrasound my interpretation no evidence of any kidney stones, perinephric stranding or hydronephrosis.  I also reviewed radiologist interpretation and agree with their findings.  PROCEDURES:  Critical Care performed: No  Procedures    MEDICATIONS ORDERED IN ED: Medications  acetaminophen (TYLENOL) tablet 1,000 mg (1,000 mg Oral Given 06/20/21 1202)  potassium chloride SA (KLOR-CON  M) CR tablet 40 mEq (40 mEq Oral Given 06/20/21 1201)     IMPRESSION / MDM / ASSESSMENT AND PLAN / ED COURSE  I reviewed the triage vital signs and the nursing notes. Patient's presentation is most consistent with acute presentation with potential threat to life or bodily function.                               Differential diagnosis includes, but is not limited to threatened miscarriage, urinary tract infection with low suspicion for pyelonephritis given absence of any significant flank pain today CVA tenderness or burning with urination.  Pelvic OB ultrasound interpreted myself shows a single IUP at 13 weeks with a heart rate of 153 without evidence of hemorrhage and no free fluid.  I also reviewed radiology's interpretation.  Renal ultrasound my interpretation no evidence of any kidney stones, perinephric stranding or hydronephrosis.  I also reviewed radiologist interpretation and agree with their findings.  CBC was somewhat nonspecific WC count of 12.4 with normal hemoglobin and platelets.  BMP is remarkable for K of 3.3 without any other significant derangements.  Patient is Rh+ and her indication for RhoGAM at this time.  hCG is 150,000.  UA has some trace leukocyte esterase and rare bacteria but otherwise not suggestive of infection.  There are some glucose in the urine.  Patient on review of records it seems as being evaluated for insulin resistance by her OB practice.  Do not believe this requires further emergency work-up at this time.  Wet prep and GC studies are negative.  We will treat bacteria in her urine with a short course of Keflex.  At this point of the low suspicion for immediate life-threatening process I think patient stable for discharge and close outpatient follow-up.  Discharged in stable condition.  Strict return precautions advised and discussed.        FINAL CLINICAL IMPRESSION(S) / ED DIAGNOSES   Final diagnoses:  Vaginal bleeding in pregnancy  Bacteriuria   Hypokalemia  Glucosuria     Rx / DC Orders   ED Discharge Orders          Ordered    cephALEXin (KEFLEX) 500 MG capsule  4 times daily        06/20/21 1327             Note:  This document was prepared using Dragon voice recognition software and may include unintentional dictation errors.   Gilles Chiquito, MD 06/20/21 1331    Gilles Chiquito, MD 06/20/21 2250

## 2021-06-20 NOTE — ED Notes (Signed)
Pt ambulatory to treatment room. Pt is [redacted] weeks pregnant, has vaginal spotting since yesterday. No clots., color is pink and brown. Denies pelvic or abdominal pain. Had u/s already.

## 2021-06-20 NOTE — ED Notes (Signed)
Pt A&O, discharge instructions, given, pt ambulating with steady gait.

## 2021-06-20 NOTE — ED Triage Notes (Signed)
Pt here with vaginal bleeding that started yesterday. Pt is currently [redacted] weeks pregnant. Pt states she notices the blood when she urinates. Pt denies pain.

## 2021-06-20 NOTE — Telephone Encounter (Signed)
Patient called with concerns about heavy spotting since yesterday. Per consult with provider, Lori Smith, CNM, patient counseled to go to the ED. Patient states understaning. Interpreter V. Olmedo.Burt Knack, RN

## 2021-06-25 ENCOUNTER — Telehealth: Payer: Self-pay | Admitting: Family Medicine

## 2021-06-25 NOTE — Telephone Encounter (Signed)
Call to client with Marlene Yemen interpreting. Client evaluated in [facility] ED 06/20/2021 where ordered Keflex QID x5 days. Per client, picked up prescription that day and will complete 5 days of medicine today (stated given 20 pills). Client also given po potassium while in ED (3.3). Per client, to follow up with OB provider   3 - 5 days from ED visit due to her potassium. Client has 3 hour GTT appt 06/29/21. 3 hour GTT in Nurse Clinic discontinued and OB problem visit scheduled and will include 3 hour GTT. Client agreeable with plan. Jossie Ng, RN

## 2021-06-25 NOTE — Telephone Encounter (Signed)
Patient went to the ER this past weekend and they told her she has a bacteria in her urine was told to follow up with [place] in 3-5 days.

## 2021-06-25 NOTE — Telephone Encounter (Signed)
Chat message has been sent to [facility] for a provider to give guidance. Hart Carwin, RN

## 2021-06-28 NOTE — Addendum Note (Signed)
Addended by: Heywood Bene on: 06/28/2021 02:06 PM   Modules accepted: Orders

## 2021-06-29 ENCOUNTER — Ambulatory Visit: Payer: Medicaid Other | Admitting: Advanced Practice Midwife

## 2021-06-29 ENCOUNTER — Other Ambulatory Visit: Payer: Medicaid Other

## 2021-06-29 VITALS — BP 102/69 | HR 70 | Temp 97.6°F | Wt 163.0 lb

## 2021-06-29 DIAGNOSIS — R7309 Other abnormal glucose: Secondary | ICD-10-CM

## 2021-06-29 DIAGNOSIS — O09522 Supervision of elderly multigravida, second trimester: Secondary | ICD-10-CM | POA: Diagnosis not present

## 2021-06-29 DIAGNOSIS — O99212 Obesity complicating pregnancy, second trimester: Secondary | ICD-10-CM | POA: Diagnosis not present

## 2021-06-29 DIAGNOSIS — O2340 Unspecified infection of urinary tract in pregnancy, unspecified trimester: Secondary | ICD-10-CM | POA: Insufficient documentation

## 2021-06-29 DIAGNOSIS — O99211 Obesity complicating pregnancy, first trimester: Secondary | ICD-10-CM

## 2021-06-29 DIAGNOSIS — O2342 Unspecified infection of urinary tract in pregnancy, second trimester: Secondary | ICD-10-CM | POA: Diagnosis not present

## 2021-06-29 DIAGNOSIS — O99210 Obesity complicating pregnancy, unspecified trimester: Secondary | ICD-10-CM

## 2021-06-29 DIAGNOSIS — O09523 Supervision of elderly multigravida, third trimester: Secondary | ICD-10-CM

## 2021-06-29 DIAGNOSIS — O09529 Supervision of elderly multigravida, unspecified trimester: Secondary | ICD-10-CM

## 2021-06-29 DIAGNOSIS — O2341 Unspecified infection of urinary tract in pregnancy, first trimester: Secondary | ICD-10-CM

## 2021-06-29 DIAGNOSIS — O09521 Supervision of elderly multigravida, first trimester: Secondary | ICD-10-CM

## 2021-06-30 LAB — GLUCOSE TOLERANCE TEST, 6 HOUR
Glucose, 1 Hour GTT: 133 mg/dL (ref 70–199)
Glucose, 2 hour: 145 mg/dL — ABNORMAL HIGH (ref 70–139)
Glucose, 3 hour: 128 mg/dL — ABNORMAL HIGH (ref 70–109)
Glucose, GTT - Fasting: 75 mg/dL (ref 70–99)

## 2021-06-30 LAB — COMPREHENSIVE METABOLIC PANEL
ALT: 16 IU/L (ref 0–32)
AST: 15 IU/L (ref 0–40)
Albumin/Globulin Ratio: 1.3 (ref 1.2–2.2)
Albumin: 3.8 g/dL (ref 3.8–4.8)
Alkaline Phosphatase: 80 IU/L (ref 44–121)
BUN/Creatinine Ratio: 9 (ref 9–23)
BUN: 4 mg/dL — ABNORMAL LOW (ref 6–20)
Bilirubin Total: 0.3 mg/dL (ref 0.0–1.2)
CO2: 21 mmol/L (ref 20–29)
Calcium: 9.2 mg/dL (ref 8.7–10.2)
Chloride: 104 mmol/L (ref 96–106)
Creatinine, Ser: 0.46 mg/dL — ABNORMAL LOW (ref 0.57–1.00)
Globulin, Total: 3 g/dL (ref 1.5–4.5)
Glucose: 151 mg/dL — ABNORMAL HIGH (ref 70–99)
Potassium: 4.2 mmol/L (ref 3.5–5.2)
Sodium: 145 mmol/L — ABNORMAL HIGH (ref 134–144)
Total Protein: 6.8 g/dL (ref 6.0–8.5)
eGFR: 128 mL/min/{1.73_m2} (ref >59)

## 2021-07-01 LAB — URINE CULTURE: Organism ID, Bacteria: NO GROWTH

## 2021-07-01 LAB — PROTEIN / CREATININE RATIO, URINE
Creatinine, Urine: 101.5 mg/dL
Protein, Ur: 9.3 mg/dL
Protein/Creat Ratio: 92 mg/g creat (ref 0–200)

## 2021-07-11 ENCOUNTER — Ambulatory Visit: Payer: Medicaid Other

## 2021-07-16 ENCOUNTER — Ambulatory Visit: Payer: Medicaid Other | Admitting: Advanced Practice Midwife

## 2021-07-16 VITALS — BP 113/71 | HR 85 | Temp 97.3°F | Wt 166.2 lb

## 2021-07-16 DIAGNOSIS — O09522 Supervision of elderly multigravida, second trimester: Secondary | ICD-10-CM

## 2021-07-16 DIAGNOSIS — O99212 Obesity complicating pregnancy, second trimester: Secondary | ICD-10-CM

## 2021-07-16 DIAGNOSIS — R7309 Other abnormal glucose: Secondary | ICD-10-CM | POA: Diagnosis not present

## 2021-07-16 DIAGNOSIS — O99210 Obesity complicating pregnancy, unspecified trimester: Secondary | ICD-10-CM

## 2021-07-16 DIAGNOSIS — O09523 Supervision of elderly multigravida, third trimester: Secondary | ICD-10-CM

## 2021-07-16 DIAGNOSIS — O09529 Supervision of elderly multigravida, unspecified trimester: Secondary | ICD-10-CM

## 2021-07-16 DIAGNOSIS — O09521 Supervision of elderly multigravida, first trimester: Secondary | ICD-10-CM

## 2021-07-16 NOTE — Progress Notes (Signed)
[facility]  PRENATAL VISIT NOTE  Subjective:  Lori Smith is a 36 y.o. G2P1001 at [redacted]w[redacted]d being seen today for ongoing prenatal care.  She is currently monitored for the following issues for this high-risk pregnancy and has  Polycystic ovarian disease dx'd age 58; Obesity affecting pregnancy BMI=30.9; Advanced maternal age in multigravida 36 yo; Supervision of high-risk pregnancy of elderly multigravida, first trimester; Advanced paternal age 53; History of pp hemorrhage EBL: 500 cc 05/13/13; Abnormal glucose 1 hour glucola=140 on 06/12/21; and UTI (urinary tract infection) during pregnancy dx'd [facility] 06/20/21 on their problem list.  Patient reports no complaints.  Contractions: Not present. Vag. Bleeding: None.  Movement: Present. Denies leaking of fluid/ROM.   The following portions of the patient's history were reviewed and updated as appropriate: allergies, current medications, past family history, past medical history, past social history, past surgical history and problem list. Problem list updated.  Objective:   Vitals:   07/16/21 1444  BP: 113/71  Pulse: 85  Temp: (!) 97.3 F (36.3 C)  Weight: 166 lb 3.2 oz (75.4 kg)    Fetal Status: Fetal Heart Rate (bpm): 160 Fundal Height: 18 cm Movement: Present     General:  Alert, oriented and cooperative. Patient is in no acute distress.  Skin: Skin is warm and dry. No rash noted.   Cardiovascular: Normal heart rate noted  Respiratory: Normal respiratory effort, no problems with respiration noted  Abdomen: Soft, gravid, appropriate for gestational age.  Pain/Pressure: Absent     Pelvic: Cervical exam deferred        Extremities: Normal range of motion.  Edema: None  Mental Status: Normal mood and affect. Normal behavior. Normal judgment and thought content.   Assessment and Plan:  Pregnancy: G2P1001 at 102w2d  1. Supervision of elderly multigravida in third trimester Working 24  hours/wk Here with 2 daughters Has first u/s 07/31/21 Desires NIPS today Finished Keflex for UTI dx'd in ER 06/20/21 and TOC neg on 06/29/21 K elevated on CMP 06/29/21 (145) f/u from ER when was low - MaterniT21 PLUS Core - AFP, Serum, Open Spina Bifida  2. Antepartum multigravida of advanced maternal age 53 yo Genetic counseling apt 07/31/21  3. Abnormal glucose 1 hour glucola=140 on 06/12/21 3 hour GTT wnl on 06/29/21   5. Obesity affecting pregnancy, antepartum Taking ASA 81 mg daily 6 lb 3.2 oz (2.812 kg) Not exercising--encouraged to do so regularly   Preterm labor symptoms and general obstetric precautions including but not limited to vaginal bleeding, contractions, leaking of fluid and fetal movement were reviewed in detail with the patient. Please refer to After Visit Summary for other counseling recommendations.  Return in about 4 weeks (around 08/13/2021) for routine PNC.  Future Appointments  Date Time Provider Department Center  07/31/2021  8:00 AM [facility]    Alberteen Spindle, CNM

## 2021-07-16 NOTE — Progress Notes (Signed)
Patient here for MHRV at 16 2/7. Aware of [facility] U/S on 07/31/21. Desires Materni 21 and AFP only today. MyChart code sent to patient.Burt Knack, RN

## 2021-07-21 LAB — AFP, SERUM, OPEN SPINA BIFIDA
AFP MoM: 1.17
AFP Value: 38.7 ng/mL
Gest. Age on Collection Date: 16.2 weeks
Maternal Age At EDD: 36.4 yr
OSBR Risk 1 IN: 7137
Test Results:: NEGATIVE
Weight: 166 [lb_av]

## 2021-07-21 LAB — MATERNIT 21 PLUS CORE, BLOOD
Fetal Fraction: 9
Result (T21): NEGATIVE
Trisomy 13 (Patau syndrome): NEGATIVE
Trisomy 18 (Edwards syndrome): NEGATIVE
Trisomy 21 (Down syndrome): NEGATIVE

## 2021-07-31 ENCOUNTER — Ambulatory Visit: Payer: Self-pay | Attending: Obstetrics and Gynecology

## 2021-07-31 ENCOUNTER — Encounter: Payer: Self-pay | Admitting: Advanced Practice Midwife

## 2021-07-31 ENCOUNTER — Other Ambulatory Visit: Payer: Self-pay

## 2021-07-31 VITALS — BP 121/76 | HR 94 | Temp 98.3°F | Ht 60.0 in | Wt 168.5 lb

## 2021-07-31 DIAGNOSIS — Z3A18 18 weeks gestation of pregnancy: Secondary | ICD-10-CM | POA: Insufficient documentation

## 2021-07-31 DIAGNOSIS — O09521 Supervision of elderly multigravida, first trimester: Secondary | ICD-10-CM

## 2021-07-31 DIAGNOSIS — E669 Obesity, unspecified: Secondary | ICD-10-CM | POA: Insufficient documentation

## 2021-07-31 DIAGNOSIS — Z363 Encounter for antenatal screening for malformations: Secondary | ICD-10-CM | POA: Insufficient documentation

## 2021-07-31 DIAGNOSIS — O99212 Obesity complicating pregnancy, second trimester: Secondary | ICD-10-CM | POA: Insufficient documentation

## 2021-07-31 DIAGNOSIS — R7309 Other abnormal glucose: Secondary | ICD-10-CM

## 2021-07-31 DIAGNOSIS — O09522 Supervision of elderly multigravida, second trimester: Secondary | ICD-10-CM | POA: Insufficient documentation

## 2021-08-13 ENCOUNTER — Ambulatory Visit: Payer: Self-pay

## 2021-08-24 ENCOUNTER — Ambulatory Visit: Payer: Self-pay | Admitting: Advanced Practice Midwife

## 2021-08-24 VITALS — BP 105/62 | HR 85 | Temp 98.0°F | Wt 171.4 lb

## 2021-08-24 DIAGNOSIS — R7309 Other abnormal glucose: Secondary | ICD-10-CM

## 2021-08-24 DIAGNOSIS — O09521 Supervision of elderly multigravida, first trimester: Secondary | ICD-10-CM

## 2021-08-24 DIAGNOSIS — O99210 Obesity complicating pregnancy, unspecified trimester: Secondary | ICD-10-CM

## 2021-08-24 DIAGNOSIS — O09522 Supervision of elderly multigravida, second trimester: Secondary | ICD-10-CM

## 2021-08-24 DIAGNOSIS — Z0281 Encounter for paternity testing: Secondary | ICD-10-CM

## 2021-08-24 DIAGNOSIS — O99212 Obesity complicating pregnancy, second trimester: Secondary | ICD-10-CM

## 2021-08-24 DIAGNOSIS — O09529 Supervision of elderly multigravida, unspecified trimester: Secondary | ICD-10-CM

## 2021-08-24 NOTE — Progress Notes (Signed)
Kept 07/31/2021 [place] appt and aware of next [facility] [place] appt on 09/11/21 at 0900. Jossie Ng, RN

## 2021-08-24 NOTE — Progress Notes (Signed)
[facility]  PRENATAL VISIT NOTE  Subjective:  Lori Smith is a 36 y.o. G2P1001 at [redacted]w[redacted]d being seen today for ongoing prenatal care.  She is currently monitored for the following issues for this high-risk pregnancy and has  Polycystic ovarian disease dx'd age 69; Obesity affecting pregnancy BMI=30.9; Advanced maternal age in multigravida 36 yo; Supervision of high-risk pregnancy of elderly multigravida, first trimester; Advanced paternal age 20; History of pp hemorrhage EBL: 500 cc 05/13/13; Abnormal glucose 1 hour glucola=140 on 06/12/21; and UTI (urinary tract infection) during pregnancy dx'd [facility] 06/20/21 on their problem list.  Patient reports  nausea while brushing teeth .  Contractions: Not present. Vag. Bleeding: None.  Movement: Present. Denies leaking of fluid/ROM.   The following portions of the patient's history were reviewed and updated as appropriate: allergies, current medications, past family history, past medical history, past social history, past surgical history and problem list. Problem list updated.  Objective:   Vitals:   08/24/21 1441  BP: 105/62  Pulse: 85  Temp: 98 F (36.7 C)  Weight: 171 lb 6.4 oz (77.7 kg)    Fetal Status: Fetal Heart Rate (bpm): 135 Fundal Height: 22 cm Movement: Present     General:  Alert, oriented and cooperative. Patient is in no acute distress.  Skin: Skin is warm and dry. No rash noted.   Cardiovascular: Normal heart rate noted  Respiratory: Normal respiratory effort, no problems with respiration noted  Abdomen: Soft, gravid, appropriate for gestational age.  Pain/Pressure: Absent     Pelvic: Cervical exam deferred        Extremities: Normal range of motion.  Edema: None  Mental Status: Normal mood and affect. Normal behavior. Normal judgment and thought content.   Assessment and Plan:  Pregnancy: G2P1001 at [redacted]w[redacted]d  1. Abnormal glucose 1 hour glucola=140 on 06/12/21 Wnl 3 hour GTT on  06/29/21 Needs 3 hour GTT at 28 wks  2. Supervision of high-risk pregnancy of elderly multigravida, first trimester Working 18 hrs/wk Reviewed 07/31/21 u/s at 18 4/7 with AFI wnl, anterior placenta, EFW=55%, 3VC Has next u/s 09/11/21 Here with daughter  3. Obesity affecting pregnancy, antepartum 11 lb 6.4 oz (5.171 kg) Taking ASA 81 mg daily Walking 5x/wk x 20 min  4. Antepartum multigravida of advanced maternal age 30 yo NIPS 07/16/21 neg AFP only 07/16/21=neg  5. Advanced paternal age 22    Preterm labor symptoms and general obstetric precautions including but not limited to vaginal bleeding, contractions, leaking of fluid and fetal movement were reviewed in detail with the patient. Please refer to After Visit Summary for other counseling recommendations.  No follow-ups on file.  Future Appointments  Date Time Provider Department Center  09/11/2021  9:00 AM [facility]  09/21/2021  9:00 AM [facility] PROVIDER [facility] None    Alberteen Spindle, CNM

## 2021-09-06 ENCOUNTER — Other Ambulatory Visit: Payer: Self-pay

## 2021-09-06 DIAGNOSIS — Z8759 Personal history of other complications of pregnancy, childbirth and the puerperium: Secondary | ICD-10-CM

## 2021-09-06 DIAGNOSIS — O99212 Obesity complicating pregnancy, second trimester: Secondary | ICD-10-CM

## 2021-09-06 DIAGNOSIS — O09522 Supervision of elderly multigravida, second trimester: Secondary | ICD-10-CM

## 2021-09-11 ENCOUNTER — Ambulatory Visit: Payer: Self-pay | Attending: Obstetrics and Gynecology

## 2021-09-11 ENCOUNTER — Other Ambulatory Visit: Payer: Self-pay

## 2021-09-11 VITALS — BP 114/73 | HR 93 | Temp 98.4°F | Wt 174.5 lb

## 2021-09-11 DIAGNOSIS — Z3A24 24 weeks gestation of pregnancy: Secondary | ICD-10-CM | POA: Insufficient documentation

## 2021-09-11 DIAGNOSIS — E669 Obesity, unspecified: Secondary | ICD-10-CM

## 2021-09-11 DIAGNOSIS — Z362 Encounter for other antenatal screening follow-up: Secondary | ICD-10-CM | POA: Insufficient documentation

## 2021-09-11 DIAGNOSIS — O09292 Supervision of pregnancy with other poor reproductive or obstetric history, second trimester: Secondary | ICD-10-CM

## 2021-09-11 DIAGNOSIS — O09299 Supervision of pregnancy with other poor reproductive or obstetric history, unspecified trimester: Secondary | ICD-10-CM | POA: Insufficient documentation

## 2021-09-11 DIAGNOSIS — O09522 Supervision of elderly multigravida, second trimester: Secondary | ICD-10-CM | POA: Insufficient documentation

## 2021-09-11 DIAGNOSIS — O99212 Obesity complicating pregnancy, second trimester: Secondary | ICD-10-CM | POA: Insufficient documentation

## 2021-09-11 DIAGNOSIS — Z8759 Personal history of other complications of pregnancy, childbirth and the puerperium: Secondary | ICD-10-CM

## 2021-09-11 DIAGNOSIS — R7309 Other abnormal glucose: Secondary | ICD-10-CM

## 2021-09-21 ENCOUNTER — Ambulatory Visit: Payer: Self-pay | Admitting: Nurse Practitioner

## 2021-09-21 ENCOUNTER — Encounter: Payer: Self-pay | Admitting: Nurse Practitioner

## 2021-09-21 VITALS — BP 102/60 | HR 89 | Temp 97.7°F | Wt 176.0 lb

## 2021-09-21 DIAGNOSIS — O99212 Obesity complicating pregnancy, second trimester: Secondary | ICD-10-CM

## 2021-09-21 DIAGNOSIS — O09522 Supervision of elderly multigravida, second trimester: Secondary | ICD-10-CM

## 2021-09-21 DIAGNOSIS — O99210 Obesity complicating pregnancy, unspecified trimester: Secondary | ICD-10-CM

## 2021-09-21 DIAGNOSIS — O09521 Supervision of elderly multigravida, first trimester: Secondary | ICD-10-CM

## 2021-09-21 LAB — URINALYSIS
Bilirubin, UA: NEGATIVE
Ketones, UA: NEGATIVE
Nitrite, UA: NEGATIVE
Protein,UA: NEGATIVE
RBC, UA: NEGATIVE
Specific Gravity, UA: 1.01 (ref 1.005–1.030)
Urobilinogen, Ur: 0.2 mg/dL (ref 0.2–1.0)
pH, UA: 6 (ref 5.0–7.5)

## 2021-09-21 MED ORDER — PRENATAL MULTIVITAMIN CH: 1.0000 | ORAL_TABLET | Freq: Every day | ORAL | 0 refills | Status: DC

## 2021-09-21 NOTE — Progress Notes (Signed)
Urine dip reviewed during clinic visit. (Patient c/o of increase urination especially at night time).   Provider A. White, FNP added urine culture. Order walked over to lab and notified lab of added order.   PNV dispensed to patient per request.   Earlyne Iba, RN

## 2021-09-21 NOTE — Progress Notes (Signed)
[facility]  PRENATAL VISIT NOTE  Subjective:  Lori Smith is a 36 y.o. G2P1001 at [redacted]w[redacted]d being seen today for ongoing prenatal care.  She is currently monitored for the following issues for this high-risk pregnancy and has  Polycystic ovarian disease dx'd age 67; Obesity affecting pregnancy BMI=30.9; Advanced maternal age in multigravida 36 yo; Supervision of high-risk pregnancy of elderly multigravida, first trimester; Advanced paternal age 12; History of pp hemorrhage EBL: 500 cc 05/13/13; Abnormal glucose 1 hour glucola=140 on 06/12/21; and UTI (urinary tract infection) during pregnancy dx'd [facility] 06/20/21 on their problem list.  Patient reports  frequent urination .  Contractions: Not present. Vag. Bleeding: None.  Movement: Present. Denies leaking of fluid/ROM.   The following portions of the patient's history were reviewed and updated as appropriate: allergies, current medications, past family history, past medical history, past social history, past surgical history and problem list. Problem list updated.  Objective:   Vitals:   09/21/21 0850  BP: 102/60  Pulse: 89  Temp: 97.7 F (36.5 C)  Weight: 176 lb (79.8 kg)    Fetal Status: Fetal Heart Rate (bpm): 160 Fundal Height: 27 cm Movement: Present     General:  Alert, oriented and cooperative. Patient is in no acute distress.  Skin: Skin is warm and dry. No rash noted.   Cardiovascular: Normal heart rate noted  Respiratory: Normal respiratory effort, no problems with respiration noted  Abdomen: Soft, gravid, appropriate for gestational age.  Pain/Pressure: Absent     Pelvic: Cervical exam deferred        Extremities: Normal range of motion.  Edema: None  Mental Status: Normal mood and affect. Normal behavior. Normal judgment and thought content.   Assessment and Plan:  Pregnancy: G2P1001 at 102w6d  1. Supervision of high-risk pregnancy of elderly multigravida, first  trimester -36 year old female in clinic today for prenatal care. -ROS reviewed, patient reports an increase in frequent urination.  Patient denies burning with urination, cramping, and foul odor.  Will obtain a urine dip and culture today. Urine dip= 1+ leukocytes, await culture results.  -Patient reports taking PNV daily. -PHQ-9= 1 -PTL reviewed with patient.   -Anatomy  scan on 09/11/21 ([redacted]w[redacted]d), EFW 62%, placenta anterior, normal amniotic fluid, follow up in 8 weeks.  -MAT21 on 07/16/21= negative  - Prenatal Vit-Fe Fumarate-FA (PRENATAL MULTIVITAMIN) TABS tablet; Take 1 tablet by mouth daily at 12 noon.  Dispense: 100 tablet; Refill: 0 - Urinalysis (Urine Dip) - Urine Culture & Sensitivity  2. Obesity affecting pregnancy, antepartum -Patient reports exercising around 4-5 times a week. -Patient reports taking ASA. -Advised patient to continue to limit carbs, sugar, and fatty foods.  Advised to increase physical activity and increase water intake.  Will continue to monitor weight gain.  -16 lb (7.258 kg)    Due to a language barrier an interpreter Salli Real) was used for the provider portion of the visit.     Term labor symptoms and general obstetric precautions including but not limited to vaginal bleeding, contractions, leaking of fluid and fetal movement were reviewed in detail with the patient. Please refer to After Visit Summary for other counseling recommendations.   Return in about 4 weeks (around 10/19/2021) for Routine prenatal care visit.  Future Appointments  Date Time Provider Department Center  10/05/2021  9:00 AM [facility] PROVIDER [facility] None  11/06/2021 10:00 AM [facility]    Glenna Fellows, FNP

## 2021-09-24 LAB — URINE CULTURE

## 2021-10-01 ENCOUNTER — Encounter: Payer: Self-pay | Admitting: General Practice

## 2021-10-05 ENCOUNTER — Ambulatory Visit: Payer: Self-pay | Admitting: Advanced Practice Midwife

## 2021-10-05 VITALS — BP 120/67 | HR 96 | Temp 97.3°F | Wt 176.2 lb

## 2021-10-05 DIAGNOSIS — R7309 Other abnormal glucose: Secondary | ICD-10-CM

## 2021-10-05 DIAGNOSIS — O99212 Obesity complicating pregnancy, second trimester: Secondary | ICD-10-CM

## 2021-10-05 DIAGNOSIS — O99210 Obesity complicating pregnancy, unspecified trimester: Secondary | ICD-10-CM

## 2021-10-05 DIAGNOSIS — O09529 Supervision of elderly multigravida, unspecified trimester: Secondary | ICD-10-CM

## 2021-10-05 DIAGNOSIS — O2200 Varicose veins of lower extremity in pregnancy, unspecified trimester: Secondary | ICD-10-CM | POA: Insufficient documentation

## 2021-10-05 DIAGNOSIS — O09521 Supervision of elderly multigravida, first trimester: Secondary | ICD-10-CM

## 2021-10-05 DIAGNOSIS — Z0281 Encounter for paternity testing: Secondary | ICD-10-CM

## 2021-10-05 DIAGNOSIS — Z23 Encounter for immunization: Secondary | ICD-10-CM

## 2021-10-05 DIAGNOSIS — O2202 Varicose veins of lower extremity in pregnancy, second trimester: Secondary | ICD-10-CM

## 2021-10-05 DIAGNOSIS — O09522 Supervision of elderly multigravida, second trimester: Secondary | ICD-10-CM

## 2021-10-05 NOTE — Progress Notes (Signed)
[place] [facility]  PRENATAL VISIT NOTE  Subjective:  Lori Smith is a 36 y.o. G2P1001 at [redacted]w[redacted]d being seen today for ongoing prenatal care.  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  Patient reports  vulvar "bumps"  .  Contractions: Not present. Vag. Bleeding: None.  Movement: Present. Denies leaking of fluid/ROM.   The following portions of the patient's history were reviewed and updated as appropriate: allergies, current medications, past family history, past medical history, past social history, past surgical history and problem list. Problem list updated.  Objective:   Vitals:   10/05/21 0847  BP: 120/67  Pulse: 96  Temp: (!) 97.3 F (36.3 C)  Weight: 176 lb 3.2 oz (79.9 kg)    Fetal Status: Fetal Heart Rate (bpm): 130 Fundal Height: 28 cm Movement: Present     General:  Alert, oriented and cooperative. Patient is in no acute distress.  Skin: Skin is warm and dry. No rash noted.   Cardiovascular: Normal heart rate noted  Respiratory: Normal respiratory effort, no problems with respiration noted  Abdomen: Soft, gravid, appropriate for gestational age.  Pain/Pressure: Absent     Pelvic: Cervical exam deferred        Extremities: Normal range of motion.  Edema: None  Mental Status: Normal mood and affect. Normal behavior. Normal judgment and thought content.   Assessment and Plan:  Pregnancy: G2P1001 at [redacted]w[redacted]d  1. Abnormal glucose 1 hour glucola=140 on  06/12/21 Needed repeat 3 hour GTT but wasn't told so is not fasting this am; to return 10/09/21 for 3 hour GTT 06/29/21 3 hour GTT=wnl  2. Supervision of high-risk pregnancy of elderly multigravida, first trimester 16 lb 3.2 oz (7.348 kg) Working 18 hrs/wk Walking 4-5x/wk x 20-30 min C/o leg cramps--suggestions given 09/21/21 C&S neg 28 wk labs today  - Hemoglobin, fingerstick - RPR - Tdap vaccine greater than or equal to 7yo IM - HIV-1/HIV-2 Qualitative RNA  3. Advanced paternal age 41   4. Antepartum multigravida of advanced maternal age 57 yo NIPS neg 07/16/21  5. Obesity affecting pregnancy, antepartum 16 lb 3.2 oz (7.348 kg) Taking ASA 81 mg daily  6. Vulvar varicosities moderate Suggestions given Noticed vulvar discomfort 09/30/21   Preterm labor symptoms and general obstetric precautions including but not limited to vaginal bleeding, contractions, leaking of fluid and fetal movement were reviewed in detail with the patient. Please refer to After Visit Summary for other counseling recommendations.  Return in about 2 weeks (around 10/19/2021) for routine PNC.  Future Appointments  Date Time Provider [place]  10/09/2021  8:50 AM [facility] NURSE [facility] None  11/06/2021 10:00 AM [facility] US1 [facility] [place], CNM

## 2021-10-05 NOTE — Progress Notes (Signed)
Hgb reviewed during clinic visit - no treatment indicated.    Al Decant, RN

## 2021-10-07 LAB — RPR: RPR Ser Ql: NONREACTIVE

## 2021-10-07 LAB — GLUCOSE, 1 HOUR GESTATIONAL: Gestational Diabetes Screen: 108 mg/dL (ref 70–139)

## 2021-10-07 LAB — SPECIMEN STATUS REPORT

## 2021-10-08 ENCOUNTER — Telehealth: Payer: Self-pay

## 2021-10-08 LAB — HEMOGLOBIN, FINGERSTICK: Hemoglobin: 11.5 g/dL (ref 11.1–15.9)

## 2021-10-08 NOTE — Telephone Encounter (Signed)
TC to [facility] to inquire about 10/05/21 lab samples/results. Per [facility] they are unable to run the HIV test as sample was not frozen. Patient will need HIV drawn during 3 hour glucose testing visit.Marland KitchenMarland KitchenJenetta Downer, RN

## 2021-10-09 ENCOUNTER — Other Ambulatory Visit: Payer: Self-pay

## 2021-10-09 DIAGNOSIS — O09529 Supervision of elderly multigravida, unspecified trimester: Secondary | ICD-10-CM

## 2021-10-09 DIAGNOSIS — R7309 Other abnormal glucose: Secondary | ICD-10-CM

## 2021-10-09 NOTE — Progress Notes (Signed)
In nurse clinic for 3 hr GTT and redraw for HIV. M [place] interpreter. Pt reports npo since 8 pm last night. Instructions reviewed for test today. Verbalizes understanding. Josie Saunders, RN

## 2021-10-11 LAB — HIV-1/HIV-2 QUALITATIVE RNA
HIV-1 RNA, Qualitative: NONREACTIVE
HIV-2 RNA, Qualitative: NONREACTIVE

## 2021-10-11 LAB — SPECIMEN STATUS REPORT

## 2021-10-11 LAB — GESTATIONAL GLUCOSE TOLERANCE
Glucose, Fasting: 76 mg/dL (ref 70–94)
Glucose, GTT - 1 Hour: 142 mg/dL (ref 70–179)
Glucose, GTT - 2 Hour: 109 mg/dL (ref 70–154)
Glucose, GTT - 3 Hour: 136 mg/dL (ref 70–139)

## 2021-10-19 ENCOUNTER — Ambulatory Visit: Payer: Self-pay | Admitting: Advanced Practice Midwife

## 2021-10-19 VITALS — BP 102/64 | HR 81 | Temp 97.7°F | Wt 176.2 lb

## 2021-10-19 DIAGNOSIS — O09529 Supervision of elderly multigravida, unspecified trimester: Secondary | ICD-10-CM

## 2021-10-19 DIAGNOSIS — O09523 Supervision of elderly multigravida, third trimester: Secondary | ICD-10-CM

## 2021-10-19 DIAGNOSIS — O99210 Obesity complicating pregnancy, unspecified trimester: Secondary | ICD-10-CM

## 2021-10-19 DIAGNOSIS — O09521 Supervision of elderly multigravida, first trimester: Secondary | ICD-10-CM

## 2021-10-19 DIAGNOSIS — O99213 Obesity complicating pregnancy, third trimester: Secondary | ICD-10-CM

## 2021-10-19 NOTE — Progress Notes (Signed)
[place] [facility]  PRENATAL VISIT NOTE  Subjective:  Lori Smith is a 36 y.o. G2P1001 at [redacted]w[redacted]d being seen today for ongoing prenatal care.  She is currently monitored for the following issues for this high-risk pregnancy and has  Polycystic ovarian disease dx'd age 64; Obesity affecting pregnancy BMI=30.9; Advanced maternal age in multigravida 36 yo; Supervision of high-risk pregnancy of elderly multigravida, first trimester; Advanced paternal age 27; History of pp hemorrhage EBL: 500 cc 05/13/13; Abnormal glucose 1 hour glucola=140 on 06/12/21; UTI (urinary tract infection) during pregnancy dx'd [facility] 06/20/21; and Vulvar varicosities moderate on their problem list.  Patient reports no complaints.  Contractions: Not present. Vag. Bleeding: None.  Movement: Present. Denies leaking of fluid/ROM.   The following portions of the patient's history were reviewed and updated as appropriate: allergies, current medications, past family history, past medical history, past social history, past surgical history and problem list. Problem list updated.  Objective:   Vitals:   10/19/21 1051  BP: 102/64  Pulse: 81  Temp: 97.7 F (36.5 C)  Weight: 176 lb 3.2 oz (79.9 kg)    Fetal Status: Fetal Heart Rate (bpm): 120 Fundal Height: 31 cm Movement: Present     General:  Alert, oriented and cooperative. Patient is in no acute distress.  Skin: Skin is warm and dry. No rash noted.   Cardiovascular: Normal heart rate noted  Respiratory: Normal respiratory effort, no problems with respiration noted  Abdomen: Soft, gravid, appropriate for gestational age.  Pain/Pressure: Absent     Pelvic: Cervical exam deferred        Extremities: Normal range of motion.  Edema: None  Mental Status: Normal mood and affect. Normal behavior. Normal judgment and thought content.   Assessment and Plan:  Pregnancy: G2P1001 at [redacted]w[redacted]d  1. Supervision of high-risk pregnancy of elderly  multigravida, first trimester Working 18 hrs/wk Walking 3-4x/wk x 20 min No car seat or crib yet 3 hour GTT 10/09/21=wnl Reveiwed 09/11/21 u/s at 24 6/7 with EFW=62%, anterior placenta, anatomy wnl, AFI wnl Growth u/s 11/06/21  2. Antepartum multigravida of advanced maternal age Nips and AFP only neg 07/16/21 Normal 07/31/21 u/s with genetic counseling performed by Dr. Donalee Citrin at that time  3. Obesity affecting pregnancy, antepartum, unspecified obesity type 16 lb 3.2 oz (7.348 kg) Taking ASA 81 mg daily No weight gain in past 2 wks   Preterm labor symptoms and general obstetric precautions including but not limited to vaginal bleeding, contractions, leaking of fluid and fetal movement were reviewed in detail with the patient. Please refer to After Visit Summary for other counseling recommendations.  Return in about 2 weeks (around 11/02/2021) for routine PNC.  Future Appointments  Date Time Provider [place]  11/06/2021 10:00 AM [facility] [place], CNM

## 2021-10-25 NOTE — Addendum Note (Signed)
Addended by: Cletis Media on: 10/25/2021 02:15 PM   Modules accepted: Orders

## 2021-11-01 ENCOUNTER — Other Ambulatory Visit: Payer: Self-pay

## 2021-11-01 DIAGNOSIS — O99213 Obesity complicating pregnancy, third trimester: Secondary | ICD-10-CM

## 2021-11-01 DIAGNOSIS — O09523 Supervision of elderly multigravida, third trimester: Secondary | ICD-10-CM

## 2021-11-02 ENCOUNTER — Ambulatory Visit: Payer: Self-pay | Admitting: Nurse Practitioner

## 2021-11-02 ENCOUNTER — Encounter: Payer: Self-pay | Admitting: Nurse Practitioner

## 2021-11-02 VITALS — BP 108/62 | HR 101 | Temp 97.1°F | Wt 176.4 lb

## 2021-11-02 DIAGNOSIS — O99210 Obesity complicating pregnancy, unspecified trimester: Secondary | ICD-10-CM

## 2021-11-02 DIAGNOSIS — O99213 Obesity complicating pregnancy, third trimester: Secondary | ICD-10-CM

## 2021-11-02 DIAGNOSIS — O09521 Supervision of elderly multigravida, first trimester: Secondary | ICD-10-CM

## 2021-11-02 DIAGNOSIS — O09523 Supervision of elderly multigravida, third trimester: Secondary | ICD-10-CM

## 2021-11-02 NOTE — Progress Notes (Signed)
[place] [facility]  PRENATAL VISIT NOTE  Subjective:  Lori Smith is a 36 y.o. G2P1001 at [redacted]w[redacted]d being seen today for ongoing prenatal care.  She is currently monitored for the following issues for this high-risk pregnancy and has  Polycystic ovarian disease dx'd age 40; Obesity affecting pregnancy BMI=30.9; Advanced maternal age in multigravida 36 yo; Supervision of high-risk pregnancy of elderly multigravida, first trimester; Advanced paternal age 8; History of pp hemorrhage EBL: 500 cc 05/13/13; Abnormal glucose 1 hour glucola=140 on 06/12/21; UTI (urinary tract infection) during pregnancy dx'd [facility] 06/20/21; and Vulvar varicosities moderate on their problem list.  Patient reports no complaints.  Contractions: Not present. Vag. Bleeding: None.  Movement: Present. Denies leaking of fluid/ROM.   The following portions of the patient's history were reviewed and updated as appropriate: allergies, current medications, past family history, past medical history, past social history, past surgical history and problem list. Problem list updated.  Objective:   Vitals:   11/02/21 0831  BP: 108/62  Pulse: (!) 101  Temp: (!) 97.1 F (36.2 C)  Weight: 176 lb 6.4 oz (80 kg)    Fetal Status: Fetal Heart Rate (bpm): 140 Fundal Height: 32 cm Movement: Present     General:  Alert, oriented and cooperative. Patient is in no acute distress.  Skin: Skin is warm and dry. No rash noted.   Cardiovascular: Normal heart rate noted  Respiratory: Normal respiratory effort, no problems with respiration noted  Abdomen: Soft, gravid, appropriate for gestational age.  Pain/Pressure: Absent     Pelvic: Cervical exam deferred        Extremities: Normal range of motion.  Edema: None  Mental Status: Normal mood and affect. Normal behavior. Normal judgment and thought content.   Assessment and Plan:  Pregnancy: G2P1001 at [redacted]w[redacted]d  1. Supervision of high-risk pregnancy of  elderly multigravida, first trimester -36 year old female in clinic today for prenatal care.  -Patient reports taking  PNV daily. -ROS reviewed, no complaints -Growth U/S scheduled for 11/06/21.  -Reviewed with patient proper ways to track kick counts.    2. Obesity affecting pregnancy, antepartum, unspecified obesity type -Patient states she is taking ASA daily. -Patient reports walking daily. -No weight gain the past couple of weeks.  -16 lb 6.4 oz (7.439 kg)    Term labor symptoms and general obstetric precautions including but not limited to vaginal bleeding, contractions, leaking of fluid and fetal movement were reviewed in detail with the patient. Please refer to After Visit Summary for other counseling recommendations.   Return in about 2 weeks (around 11/16/2021) for Routine prenatal care visit.  Due to a language barrier an interpreter Jamas Lav [place] was used for the provider portion of the visit.     Future Appointments  Date Time Provider [place]  11/06/2021 10:00 AM [facility] [place]  11/20/2021  8:20 AM [facility] PROVIDER [facility] None    Gregary Cromer, FNP

## 2021-11-03 ENCOUNTER — Ambulatory Visit: Payer: Self-pay

## 2021-11-03 DIAGNOSIS — Z23 Encounter for immunization: Secondary | ICD-10-CM

## 2021-11-06 ENCOUNTER — Other Ambulatory Visit: Payer: Self-pay

## 2021-11-06 ENCOUNTER — Ambulatory Visit: Payer: Self-pay | Attending: Obstetrics

## 2021-11-06 VITALS — BP 122/71 | HR 82 | Temp 98.0°F | Ht 59.0 in | Wt 178.5 lb

## 2021-11-06 DIAGNOSIS — O09523 Supervision of elderly multigravida, third trimester: Secondary | ICD-10-CM

## 2021-11-06 DIAGNOSIS — O99213 Obesity complicating pregnancy, third trimester: Secondary | ICD-10-CM

## 2021-11-06 DIAGNOSIS — O09293 Supervision of pregnancy with other poor reproductive or obstetric history, third trimester: Secondary | ICD-10-CM | POA: Insufficient documentation

## 2021-11-06 DIAGNOSIS — Z3A32 32 weeks gestation of pregnancy: Secondary | ICD-10-CM | POA: Insufficient documentation

## 2021-11-06 DIAGNOSIS — E669 Obesity, unspecified: Secondary | ICD-10-CM

## 2021-11-06 DIAGNOSIS — R7309 Other abnormal glucose: Secondary | ICD-10-CM

## 2021-11-20 ENCOUNTER — Ambulatory Visit: Payer: Self-pay | Admitting: Advanced Practice Midwife

## 2021-11-20 VITALS — BP 108/63 | HR 83 | Temp 97.3°F | Wt 183.6 lb

## 2021-11-20 DIAGNOSIS — O99210 Obesity complicating pregnancy, unspecified trimester: Secondary | ICD-10-CM

## 2021-11-20 DIAGNOSIS — O09529 Supervision of elderly multigravida, unspecified trimester: Secondary | ICD-10-CM

## 2021-11-20 DIAGNOSIS — O09521 Supervision of elderly multigravida, first trimester: Secondary | ICD-10-CM

## 2021-11-20 MED ORDER — PRENATAL VITAMINS 28-0.8 MG PO TABS: 1.0000 | ORAL_TABLET | Freq: Every day | ORAL | 0 refills | Status: AC

## 2021-11-20 NOTE — Progress Notes (Signed)
PNV given

## 2021-11-20 NOTE — Progress Notes (Signed)
[facility]  PRENATAL VISIT NOTE  Subjective:  Lori Smith is a 36 y.o. G2P1001 at [redacted]w[redacted]d being seen today for ongoing prenatal care.  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  Patient reports no complaints.  Contractions: Not present. Vag. Bleeding: None.  Movement: Present. Denies leaking of fluid/ROM.   The following portions of the patient's history were reviewed and updated as appropriate: allergies, current medications, past family history, past medical history, past social history, past surgical history and problem list. Problem list updated.  Objective:   Vitals:   11/20/21 0812  BP: 108/63  Pulse: 83  Temp: (!) 97.3 F (36.3 C)  Weight: 183 lb 9.6 oz (83.3 kg)    Fetal Status: Fetal Heart Rate (bpm): 142 Fundal Height: 36 cm Movement: Present     General:  Alert, oriented and cooperative. Patient is in no acute distress.  Skin: Skin is warm and dry. No rash noted.   Cardiovascular: Normal heart rate noted  Respiratory: Normal respiratory effort, no problems with respiration noted  Abdomen: Soft, gravid, appropriate for gestational age.  Pain/Pressure: Absent     Pelvic: Cervical exam deferred        Extremities: Normal range of motion.  Edema: None  Mental Status: Normal mood and affect. Normal behavior. Normal judgment and thought content.   Assessment and Plan:  Pregnancy: G2P1001 at [redacted]w[redacted]d  1. Supervision of high-risk pregnancy of elderly  multigravida, first trimester 23 lb 9.6 oz (10.7 kg) Working 10-15 hrs/wk Has car seat but no crib yet Leaning towards ocp's or DMPA pp Reviewed 11/06/21 growth u/s at 33 4/7 with AFI wnl, vtx, anterior placenta, EFW=89%  - Prenatal Vit-Fe Fumarate-FA (PRENATAL VITAMINS) 28-0.8 MG TABS; Take 1 tablet by mouth daily.  Dispense: 100 tablet; Refill: 0  2. Antepartum multigravida of advanced maternal age 41 yo   3. Obesity affecting pregnancy, antepartum, unspecified obesity type 23 lb 9.6 oz (10.7 kg) 5 lb wt gain in past 2 wks Walking 3x/wk x 15-20 min Taking ASA 81 mg daily  - Prenatal Vit-Fe Fumarate-FA (PRENATAL VITAMINS) 28-0.8 MG TABS; Take 1 tablet by mouth daily.  Dispense: 100 tablet; Refill: 0   Preterm labor symptoms and general obstetric precautions including but not limited to vaginal bleeding, contractions, leaking of fluid and fetal movement were reviewed in detail with the patient. Please refer to After Visit Summary for other counseling recommendations.  Return in about 2 weeks (around 12/04/2021) for routine PNC.  No future appointments.  Alberteen Spindle, CNM

## 2021-11-20 NOTE — Progress Notes (Signed)
Utilized Earlyne Iba RN as Teacher, English as a foreign language during nurse visit.  The patient was dispensed Prenatal Vitamins  today. I provided counseling today regarding the medication. We discussed the medication, the side effects and when to call clinic. Patient given the opportunity to ask questions. Questions answered. BThiele RN

## 2021-12-04 ENCOUNTER — Ambulatory Visit: Payer: Self-pay | Admitting: Advanced Practice Midwife

## 2021-12-04 VITALS — BP 106/61 | HR 74 | Temp 97.0°F | Wt 185.8 lb

## 2021-12-04 DIAGNOSIS — Z0281 Encounter for paternity testing: Secondary | ICD-10-CM

## 2021-12-04 DIAGNOSIS — O09529 Supervision of elderly multigravida, unspecified trimester: Secondary | ICD-10-CM

## 2021-12-04 DIAGNOSIS — O09523 Supervision of elderly multigravida, third trimester: Secondary | ICD-10-CM

## 2021-12-04 DIAGNOSIS — O99210 Obesity complicating pregnancy, unspecified trimester: Secondary | ICD-10-CM

## 2021-12-04 DIAGNOSIS — O99213 Obesity complicating pregnancy, third trimester: Secondary | ICD-10-CM

## 2021-12-04 DIAGNOSIS — O09521 Supervision of elderly multigravida, first trimester: Secondary | ICD-10-CM

## 2021-12-04 NOTE — Progress Notes (Addendum)
[facility]  PRENATAL VISIT NOTE  Subjective:  Lori Smith is a 36 y.o. G2P1001 at [redacted]w[redacted]d being seen today for ongoing prenatal care.  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  Patient reports no complaints.  Contractions: Not present. Vag. Bleeding: None.  Movement: Present. Denies leaking of fluid/ROM.   The following portions of the patient's history were reviewed and updated as appropriate: allergies, current medications, past family history, past medical history, past social history, past surgical history and problem list. Problem list updated.  Objective:   Vitals:   12/04/21 1328  BP: 106/61  Pulse: 74  Temp: (!) 97 F (36.1 C)  Weight: 185 lb 12.8 oz (84.3 kg)    Fetal Status: Fetal Heart Rate (bpm): 133 Fundal Height: 42 cm Movement: Present  Presentation: Vertex  General:  Alert, oriented and cooperative. Patient is in no acute distress.  Skin: Skin is warm and dry. No rash noted.   Cardiovascular: Normal heart rate noted  Respiratory: Normal respiratory effort, no problems with respiration noted  Abdomen: Soft, gravid, appropriate for gestational age.  Pain/Pressure: Absent     Pelvic: Cervical exam deferred        Extremities: Normal range of motion.  Edema: Trace  Mental Status: Normal mood and affect. Normal behavior. Normal judgment and thought content.   Assessment and Plan:  Pregnancy: G2P1001 at [redacted]w[redacted]d  1. Supervision of high-risk  pregnancy of elderly multigravida, first trimester GC/Chlamydia/GBS cultures today 25 lb 12.8 oz (11.7 kg) Working 10-15 hrs/wk No crib yet Knows when to go to L&D Walking 3-4x/wk x 20 min  - Culture, beta strep (group b only) - Chlamydia/GC NAA, Confirmation  2. Antepartum multigravida of advanced maternal age 23 yo NIPS neg 07/16/21  3. Obesity affecting pregnancy, antepartum, unspecified obesity type Stopped taking ASA at 36 wks  4. Advanced paternal age 27    Preterm labor symptoms and general obstetric precautions including but not limited to vaginal bleeding, contractions, leaking of fluid and fetal movement were reviewed in detail with the patient. Please refer to After Visit Summary for other counseling recommendations.  Return in about 1 week (around 12/11/2021) for routine PNC.  Future Appointments  Date Time Provider Department Center  12/14/2021  9:05 AM AC-MH PROVIDER AC-MAT None  12/21/2021  8:20 AM AC-MH PROVIDER AC-MAT None  12/28/2021  8:40 AM AC-[facility] PROVIDER AC-MAT None    Alberteen Spindle, CNM

## 2021-12-04 NOTE — Progress Notes (Signed)
Patient here for MH RV at [redacted]w[redacted]d.   Patient desires provider to collect 36 week labs.   36 week packet given and explained.   Patient informed to discontinue ASA 81mg  daily starting today. Patient verbalized understanding.   , RN

## 2021-12-08 LAB — CULTURE, BETA STREP (GROUP B ONLY): Strep Gp B Culture: NEGATIVE

## 2021-12-08 LAB — CHLAMYDIA/GC NAA, CONFIRMATION
Chlamydia trachomatis, NAA: NEGATIVE
Neisseria gonorrhoeae, NAA: NEGATIVE

## 2021-12-14 ENCOUNTER — Encounter: Payer: Self-pay | Admitting: Nurse Practitioner

## 2021-12-14 ENCOUNTER — Ambulatory Visit: Payer: Self-pay | Admitting: Nurse Practitioner

## 2021-12-14 VITALS — BP 117/69 | HR 78 | Temp 96.4°F | Wt 187.2 lb

## 2021-12-14 DIAGNOSIS — O99210 Obesity complicating pregnancy, unspecified trimester: Secondary | ICD-10-CM

## 2021-12-14 DIAGNOSIS — O09523 Supervision of elderly multigravida, third trimester: Secondary | ICD-10-CM

## 2021-12-14 DIAGNOSIS — O09521 Supervision of elderly multigravida, first trimester: Secondary | ICD-10-CM

## 2021-12-14 DIAGNOSIS — O99213 Obesity complicating pregnancy, third trimester: Secondary | ICD-10-CM

## 2021-12-14 NOTE — Progress Notes (Addendum)
[facility]  PRENATAL VISIT NOTE  Subjective:  Lori Smith is a 36 y.o. G2P1001 at [redacted]w[redacted]d being seen today for ongoing prenatal care.  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  Patient reports backache.  Contractions: Not present. Vag. Bleeding: None.  Movement: Present. Denies leaking of fluid/ROM.   The following portions of the patient's history were reviewed and updated as appropriate: allergies, current medications, past family history, past medical history, past social history, past surgical history and problem list. Problem list updated.  Objective:   Vitals:   12/14/21 0850  BP: 117/69  Pulse: 78  Temp: (!) 96.4 F (35.8 C)  Weight: 187 lb 3.2 oz (84.9 kg)    Fetal Status: Fetal Heart Rate (bpm): 150 Fundal Height: 38 cm Movement: Present  Presentation: Vertex  General:  Alert, oriented and cooperative. Patient is in no acute distress.  Skin: Skin is warm and dry. No rash noted.   Cardiovascular: Normal heart rate noted  Respiratory: Normal respiratory effort, no problems with respiration noted  Abdomen: Soft, gravid, appropriate for gestational age.  Pain/Pressure: Absent     Pelvic: Cervical exam deferred        Extremities: Normal range of motion.     Mental Status: Normal mood and affect. Normal behavior. Normal judgment and thought content.   Assessment and Plan:  Pregnancy: G2P1001 at [redacted]w[redacted]d  1. Supervision of high-risk pregnancy of  elderly multigravida, third trimester -36 year old female in clinic today for prenatal care. -ROS reviewed, patient reports some back pain.  Advised patient to have lots of support with pillows when sitting or lying down and to incorporate pillows.  -Patient reports taking PNV daily. -36 week cultures negative.  2. Obesity affecting pregnancy, antepartum, unspecified obesity type -27 lb 3.2 oz (12.3 kg)  -Encouraged patient to continue with physical activity and to increase water intake, limit foods high in sugar, fats, and carbs.   Term labor symptoms and general obstetric precautions including but not limited to vaginal bleeding, contractions, leaking of fluid and fetal movement were reviewed in detail with the patient. Please refer to After Visit Summary for other counseling recommendations.   Due to a language barrier an interpreter [phone]) was used for the provider portion of the visit.     Return in about 1 week (around 12/21/2021) for Routine prenatal care visit.  Future Appointments  Date Time Provider Department Center  12/21/2021  8:20 AM AC-MH PROVIDER AC-MAT None  12/28/2021  8:40 AM AC-MH PROVIDER AC-MAT None    Glenna Fellows, FNP

## 2021-12-21 ENCOUNTER — Ambulatory Visit: Payer: Self-pay | Admitting: Nurse Practitioner

## 2021-12-21 ENCOUNTER — Encounter: Payer: Self-pay | Admitting: Nurse Practitioner

## 2021-12-21 VITALS — BP 109/65 | HR 83 | Temp 97.1°F | Wt 185.2 lb

## 2021-12-21 DIAGNOSIS — O09521 Supervision of elderly multigravida, first trimester: Secondary | ICD-10-CM

## 2021-12-21 DIAGNOSIS — O09523 Supervision of elderly multigravida, third trimester: Secondary | ICD-10-CM

## 2021-12-21 DIAGNOSIS — O99210 Obesity complicating pregnancy, unspecified trimester: Secondary | ICD-10-CM

## 2021-12-21 DIAGNOSIS — O99212 Obesity complicating pregnancy, second trimester: Secondary | ICD-10-CM

## 2021-12-21 NOTE — Progress Notes (Addendum)
[facility] Department Maternal Health Clinic  PRENATAL VISIT NOTE  Subjective:  Lori Smith is a 36 y.o. G2P1001 at [redacted]w[redacted]d being seen today for ongoing prenatal care.  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  Patient reports backache and pressure to lower  abdomen. Contractions: Irritability. Vag. Bleeding: None.  Movement: Present. Denies leaking of fluid/ROM.   The following portions of the patient's history were reviewed and updated as appropriate: allergies, current medications, past family history, past medical history, past social history, past surgical history and problem list. Problem list updated.  Objective:   Vitals:   12/21/21 0814  BP: 109/65  Pulse: 83  Temp: (!) 97.1 F (36.2 C)  Weight: 185 lb 3.2 oz (84 kg)    Fetal Status: Fetal Heart Rate (bpm): 160 Fundal Height: 39 cm Movement: Present  Presentation: Vertex  General:  Alert, oriented and cooperative. Patient is in no acute distress.  Skin: Skin is warm and dry. No rash noted.   Cardiovascular: Normal heart rate noted  Respiratory: Normal respiratory effort, no problems with respiration noted  Abdomen: Soft, gravid, appropriate for gestational age.  Pain/Pressure: Present     Pelvic: Cervical exam performed Dilation: 1 Effacement (%): 50 Station: -3  Extremities: Normal range of motion.  Edema: Trace  Mental Status: Normal mood and affect. Normal behavior. Normal judgment and thought content.   Assessment and  Plan:  Pregnancy: G2P1001 at [redacted]w[redacted]d  1. Supervision of high-risk pregnancy of elderly multigravida, third trimester -36 year old female in clinic for prenatal care.  -ROS reviewed, complaints of back pain and lower abdominal pressure. Informed patient to apply support to back for comfort while lying and sitting and take frequent rest breaks.  -IOL paperwork completed.   2. Obesity affecting pregnancy, antepartum, unspecified obesity type -Patient states she walks at least 30 minutes a couple times a week.  Encouraged to continue to exercise.  -25 lb 3.2 oz (11.4 kg)    Term labor symptoms and general obstetric precautions including but not limited to vaginal bleeding, contractions, leaking of fluid and fetal movement were reviewed in detail with the patient. Please refer to After Visit Summary for other counseling recommendations.   Due to a language barrier an interpreter Roddie Mc Yemen) was used for the provider portion of the visit.     Return in about 1 week (around 12/28/2021) for Routine prenatal care visit.  Future Appointments  Date Time Provider Department Center  12/28/2021  8:40 AM AC-MH PROVIDER AC-MAT None    Glenna Fellows, FNP

## 2021-12-21 NOTE — Progress Notes (Addendum)
Counseled 36 week cultures were negative. Jossie Ng, RN Referral for [employer] IOL faxed with snapshot pages and fax confirmation received. Jossie Ng, RN

## 2021-12-26 ENCOUNTER — Encounter: Payer: Self-pay | Admitting: Obstetrics and Gynecology

## 2021-12-26 ENCOUNTER — Telehealth: Payer: Self-pay

## 2021-12-26 ENCOUNTER — Ambulatory Visit (INDEPENDENT_AMBULATORY_CARE_PROVIDER_SITE_OTHER): Payer: Self-pay | Admitting: Obstetrics and Gynecology

## 2021-12-26 VITALS — BP 121/79 | HR 74 | Wt 188.0 lb

## 2021-12-26 DIAGNOSIS — O09523 Supervision of elderly multigravida, third trimester: Secondary | ICD-10-CM

## 2021-12-26 DIAGNOSIS — Z3A39 39 weeks gestation of pregnancy: Secondary | ICD-10-CM

## 2021-12-26 NOTE — Progress Notes (Signed)
HPI:      Ms. Hai Nardini is a 36 y.o. G2P1001 who LMP was Patient's last menstrual period was 03/24/2021 (exact date).  Subjective:   She presents today to discuss induction of labor for postdates.  She is advanced maternal age.  She has a history of postpartum hemorrhage with her previous baby.  She denies contractions today.  Reports daily fetal movement.  She states that her labor was induced with her first baby but she is unsure of the method.    Hx: The following portions of the patient's history were reviewed and updated as appropriate:             She  has a past medical history of Migraines and Weight gain. She does not have any pertinent problems on file. She  has a past surgical history that includes Denies surgical history and No past surgeries. Her family history includes Heart disease in her maternal aunt, maternal grandfather, maternal grandmother, and paternal aunt; Liver disease in her brother. She  reports that she has never smoked. She has never used smokeless tobacco. She reports that she does not currently use alcohol. She reports that she does not use drugs. She has a current medication list which includes the following prescription(s): prenatal vitamins. She has No Known Allergies.       Review of Systems:  Review of Systems  Constitutional: Denied constitutional symptoms, night sweats, recent illness, fatigue, fever, insomnia and weight loss.  Eyes: Denied eye symptoms, eye pain, photophobia, vision change and visual disturbance.  Ears/Nose/Throat/Neck: Denied ear, nose, throat or neck symptoms, hearing loss, nasal discharge, sinus congestion and sore throat.  Cardiovascular: Denied cardiovascular symptoms, arrhythmia, chest pain/pressure, edema, exercise intolerance, orthopnea and palpitations.  Respiratory: Denied pulmonary symptoms, asthma, pleuritic pain, productive sputum, cough, dyspnea and wheezing.  Gastrointestinal: Denied, gastro-esophageal reflux,  melena, nausea and vomiting.  Genitourinary: Denied genitourinary symptoms including symptomatic vaginal discharge, pelvic relaxation issues, and urinary complaints.  Musculoskeletal: Denied musculoskeletal symptoms, stiffness, swelling, muscle weakness and myalgia.  Dermatologic: Denied dermatology symptoms, rash and scar.  Neurologic: Denied neurology symptoms, dizziness, headache, neck pain and syncope.  Psychiatric: Denied psychiatric symptoms, anxiety and depression.  Endocrine: Denied endocrine symptoms including hot flashes and night sweats.   Meds:   Current Outpatient Medications on File Prior to Visit  Medication Sig Dispense Refill   Prenatal Vit-Fe Fumarate-FA (PRENATAL VITAMINS) 28-0.8 MG TABS Take 1 tablet by mouth daily. 100 tablet 0   No current facility-administered medications on file prior to visit.      Objective:     Vitals:   12/26/21 0812  BP: 121/79  Pulse: 74   Filed Weights   12/26/21 0812  Weight: 188 lb (85.3 kg)              Fetal heart tones 138  Appropriate fundal height          Assessment:    G2P1001 Patient Active Problem List   Diagnosis Date Noted   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   UTI (urinary tract infection) during pregnancy dx'd [facility] 06/20/21 06/29/2021   Obesity affecting pregnancy BMI=30.9 06/12/2021   Advanced maternal age in multigravida 36 yo 06/12/2021   Supervision of high-risk pregnancy of elderly multigravida, first trimester 06/12/2021   Advanced paternal age 51 06/12/2021   History of pp hemorrhage EBL: 500 cc 05/13/13 06/12/2021   Abnormal glucose 1 hour glucola=140 on 06/12/21 06/12/2021    Polycystic ovarian disease dx'd age 44 09/21/2019  1. Supervision of high-risk pregnancy of elderly multigravida, third trimester     Currently doing well.  Here to discuss induction.   Plan:            1.  Discussed multiple methods of induction including Cytotec, Foley bulb AROM and Pitocin.  Combinations of  the above were also discussed.  All questions answered.  2.  Induction scheduled for 12/29 at 8 AM.   Orders No orders of the defined types were placed in this encounter.   No orders of the defined types were placed in this encounter.     F/U  No follow-ups on file. I spent 32 minutes involved in the care of this patient preparing to see the patient by obtaining and reviewing her medical history (including labs, imaging tests and prior procedures), documenting clinical information in the electronic health record (EHR), counseling and coordinating care plans, writing and sending prescriptions, ordering tests or procedures and in direct communicating with the patient and medical staff discussing pertinent items from her history and physical exam.  Elonda Husky, M.D. 12/26/2021 8:56 AM

## 2021-12-26 NOTE — Progress Notes (Signed)
ROB. Patient states she is here for delivery plans. She has been getting her prenatal care from [facility]. She has a h/o PP hemorrhage. High risk pregnancy for AMA and BMI. Growth scan done 10/31 with normal fetal growth.   Patient states no questions or concerns at this time.

## 2021-12-26 NOTE — Telephone Encounter (Signed)
Client kept delivery plans appt at [facility] 12/26/2021 with Dr. Logan Bores. Per MD note, IOL scheduled for 01/04/2022 at 0800.  Call to client with [facility] [employer] [phone] to verify aware of IOL appt. Per recorded message, unexpected problem with number. Call to emergency contact and left message to call [facility] to verify aware of IOL appt. Number to call provided. Jossie Ng, RN

## 2021-12-27 NOTE — Telephone Encounter (Signed)
Call to client with Lori Smith and verified client is aware of 01/04/2022 IOL appt at [facility]. Client reminded about [facility] RV appt tomorrow. Jossie Ng, RN

## 2021-12-28 ENCOUNTER — Encounter: Payer: Self-pay | Admitting: Nurse Practitioner

## 2021-12-28 ENCOUNTER — Ambulatory Visit: Payer: Self-pay | Admitting: Nurse Practitioner

## 2021-12-28 VITALS — BP 129/74 | HR 81 | Temp 97.6°F | Wt 187.6 lb

## 2021-12-28 DIAGNOSIS — O09523 Supervision of elderly multigravida, third trimester: Secondary | ICD-10-CM

## 2021-12-28 DIAGNOSIS — O99213 Obesity complicating pregnancy, third trimester: Secondary | ICD-10-CM

## 2021-12-28 DIAGNOSIS — O99210 Obesity complicating pregnancy, unspecified trimester: Secondary | ICD-10-CM

## 2021-12-28 NOTE — Progress Notes (Signed)
Patient here for MH RV at 39 6/7. Aware of [facility] IOL on 01/04/22.Marland KitchenBurt Knack, RN

## 2021-12-28 NOTE — Progress Notes (Unsigned)
[facility] Department M[facility] Health Clinic  PRENATAL VISIT NOTE  Subjective:  Lori Smith is a 36 y.o. G2P1001 at [redacted]w[redacted]d being seen today for ongoing prenatal care.  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  Patient reports  back and lower abdominal pain .  Contractions: Not present. Vag. Bleeding: None.  Movement: Present. Denies leaking of fluid/ROM.   The following portions of the patient's history were reviewed and updated as appropriate: allergies, current medications, past family history, past medical history, past social history, past surgical history and problem list. Problem list updated.  Objective:   Vitals:   12/28/21 0843  BP: 129/74  Pulse: 81  Temp: 97.6 F (36.4 C)  Weight: 187 lb 9.6 oz (85.1 kg)    Fetal Status: Fetal Heart Rate (bpm): 140 Fundal Height: 40 cm Movement: Present  Presentation: Vertex  General:  Alert, oriented and cooperative. Patient is in no acute distress.  Skin: Skin is warm and dry. No rash noted.   Cardiovascular: Normal heart rate noted  Respiratory: Normal respiratory effort, no problems with respiration noted  Abdomen: Soft, gravid, appropriate for gestational age.  Pain/Pressure: Present     Pelvic: Cervical exam deferred        Extremities: Normal range of motion.  Edema: None  Mental Status: Normal mood and affect. Normal behavior. Normal judgment and thought content.   Assessment and Plan:  Pregnancy: G2P1001 at [redacted]w[redacted]d  1. Supervision  of high-risk pregnancy of elderly multigravida (>= 63 years old at time of delivery), third trimester -36 year old female in clinic today for prenatal care. -ROS, patient with reports of back and lower abdominal pain.  Symptoms attributed to baby engaging into the pelvis more.  -Patient reports taking PNV daily. -IOL scheduled on 01/04/22.  2. Obesity affecting pregnancy, antepartum, unspecified obesity type -27 lb 9.6 oz (12.5 kg)  -Patient reports walking 3-4 times a week for 30 minutes.    Term labor symptoms and general obstetric precautions including but not limited to vaginal bleeding, contractions, leaking of fluid and fetal movement were reviewed in detail with the patient. Please refer to After Visit Summary for other counseling recommendations.   Due to a language barrier an interpreter Roddie Mc Yemen) was used for the provider portion of the visit.     Return in about 1 week (around 01/04/2022) for Routine prenatal care visit.   Glenna Fellows, FNP

## 2022-01-03 ENCOUNTER — Inpatient Hospital Stay
Admission: EM | Admit: 2022-01-03 | Discharge: 2022-01-09 | DRG: 786 | Disposition: A | Discharge status: Home / Self Care | Payer: Medicaid Other | Attending: Certified Nurse Midwife | Admitting: Certified Nurse Midwife

## 2022-01-03 ENCOUNTER — Other Ambulatory Visit: Payer: Self-pay

## 2022-01-03 ENCOUNTER — Encounter: Payer: Self-pay | Admitting: Obstetrics and Gynecology

## 2022-01-03 DIAGNOSIS — O480 Post-term pregnancy: Secondary | ICD-10-CM | POA: Diagnosis present

## 2022-01-03 DIAGNOSIS — O9963 Diseases of the digestive system complicating the puerperium: Secondary | ICD-10-CM | POA: Diagnosis present

## 2022-01-03 DIAGNOSIS — Z20822 Contact with and (suspected) exposure to covid-19: Secondary | ICD-10-CM | POA: Diagnosis present

## 2022-01-03 DIAGNOSIS — O411290 Chorioamnionitis, unspecified trimester, not applicable or unspecified: Secondary | ICD-10-CM | POA: Insufficient documentation

## 2022-01-03 DIAGNOSIS — O9902 Anemia complicating childbirth: Secondary | ICD-10-CM | POA: Diagnosis present

## 2022-01-03 DIAGNOSIS — B3731 Acute candidiasis of vulva and vagina: Secondary | ICD-10-CM | POA: Diagnosis present

## 2022-01-03 DIAGNOSIS — Z3A40 40 weeks gestation of pregnancy: Secondary | ICD-10-CM | POA: Diagnosis not present

## 2022-01-03 DIAGNOSIS — O99214 Obesity complicating childbirth: Secondary | ICD-10-CM | POA: Diagnosis present

## 2022-01-03 DIAGNOSIS — K567 Ileus, unspecified: Secondary | ICD-10-CM | POA: Diagnosis not present

## 2022-01-03 DIAGNOSIS — O411230 Chorioamnionitis, third trimester, not applicable or unspecified: Secondary | ICD-10-CM | POA: Diagnosis present

## 2022-01-03 DIAGNOSIS — O1494 Unspecified pre-eclampsia, complicating childbirth: Secondary | ICD-10-CM | POA: Diagnosis present

## 2022-01-03 DIAGNOSIS — O9882 Other maternal infectious and parasitic diseases complicating childbirth: Secondary | ICD-10-CM | POA: Diagnosis present

## 2022-01-03 DIAGNOSIS — O1404 Mild to moderate pre-eclampsia, complicating childbirth: Principal | ICD-10-CM | POA: Diagnosis present

## 2022-01-03 DIAGNOSIS — Z8759 Personal history of other complications of pregnancy, childbirth and the puerperium: Secondary | ICD-10-CM

## 2022-01-03 DIAGNOSIS — K9189 Other postprocedural complications and disorders of digestive system: Secondary | ICD-10-CM | POA: Diagnosis not present

## 2022-01-03 DIAGNOSIS — O1400 Mild to moderate pre-eclampsia, unspecified trimester: Secondary | ICD-10-CM | POA: Diagnosis present

## 2022-01-03 DIAGNOSIS — R7309 Other abnormal glucose: Principal | ICD-10-CM

## 2022-01-03 DIAGNOSIS — D62 Acute posthemorrhagic anemia: Secondary | ICD-10-CM | POA: Diagnosis not present

## 2022-01-03 DIAGNOSIS — O99210 Obesity complicating pregnancy, unspecified trimester: Secondary | ICD-10-CM | POA: Diagnosis present

## 2022-01-03 DIAGNOSIS — O1490 Unspecified pre-eclampsia, unspecified trimester: Secondary | ICD-10-CM

## 2022-01-03 DIAGNOSIS — O09529 Supervision of elderly multigravida, unspecified trimester: Secondary | ICD-10-CM

## 2022-01-03 HISTORY — DX: Unspecified pre-eclampsia, unspecified trimester: O14.90

## 2022-01-03 LAB — WET PREP, GENITAL
Clue Cells Wet Prep HPF POC: NONE SEEN
Sperm: NONE SEEN
Trich, Wet Prep: NONE SEEN
WBC, Wet Prep HPF POC: >=10 — AB (ref <10)

## 2022-01-03 LAB — COMPREHENSIVE METABOLIC PANEL
ALT: 19 U/L (ref 0–44)
AST: 26 U/L (ref 15–41)
Albumin: 2.7 g/dL — ABNORMAL LOW (ref 3.5–5.0)
Alkaline Phosphatase: 231 U/L — ABNORMAL HIGH (ref 38–126)
Anion gap: 9 (ref 5–15)
BUN: 9 mg/dL (ref 6–20)
CO2: 17 mmol/L — ABNORMAL LOW (ref 22–32)
Calcium: 8.8 mg/dL — ABNORMAL LOW (ref 8.9–10.3)
Chloride: 110 mmol/L (ref 98–111)
Creatinine, Ser: 0.5 mg/dL (ref 0.44–1.00)
GFR, Estimated: >60 mL/min (ref >60)
Glucose, Bld: 89 mg/dL (ref 70–99)
Potassium: 3.8 mmol/L (ref 3.5–5.1)
Sodium: 136 mmol/L (ref 135–145)
Total Bilirubin: 0.6 mg/dL (ref 0.3–1.2)
Total Protein: 6.7 g/dL (ref 6.5–8.1)

## 2022-01-03 LAB — RUPTURE OF MEMBRANE (ROM)PLUS: Rom Plus: NEGATIVE

## 2022-01-03 LAB — PROTEIN / CREATININE RATIO, URINE
Creatinine, Urine: 42 mg/dL
Protein Creatinine Ratio: 0.71 mg/mg{Cre} — ABNORMAL HIGH (ref 0.00–0.15)
Total Protein, Urine: 30 mg/dL

## 2022-01-03 LAB — CBC
HCT: 37.4 % (ref 36.0–46.0)
Hemoglobin: 12.8 g/dL (ref 12.0–15.0)
MCH: 30.3 pg (ref 26.0–34.0)
MCHC: 34.2 g/dL (ref 30.0–36.0)
MCV: 88.6 fL (ref 80.0–100.0)
Platelets: 262 10*3/uL (ref 150–400)
RBC: 4.22 MIL/uL (ref 3.87–5.11)
RDW: 12.8 % (ref 11.5–15.5)
WBC: 9.9 10*3/uL (ref 4.0–10.5)
nRBC: 0 % (ref 0.0–0.2)

## 2022-01-03 LAB — URINALYSIS, COMPLETE (UACMP) WITH MICROSCOPIC
Bilirubin Urine: NEGATIVE
Glucose, UA: NEGATIVE mg/dL
Ketones, ur: NEGATIVE mg/dL
Nitrite: NEGATIVE
Protein, ur: 30 mg/dL — AB
Specific Gravity, Urine: 1.006 (ref 1.005–1.030)
pH: 6 (ref 5.0–8.0)

## 2022-01-03 MED ORDER — LACTATED RINGERS IV SOLN: INTRAVENOUS | Status: DC

## 2022-01-03 MED ORDER — OXYTOCIN BOLUS FROM INFUSION
333.0000 mL | Freq: Once | INTRAVENOUS | Status: DC
  Administered 2022-01-04: 333 mL via INTRAVENOUS

## 2022-01-03 MED ORDER — LIDOCAINE HCL (PF) 1 % IJ SOLN: 30.0000 mL | INTRAMUSCULAR | Status: DC | PRN

## 2022-01-03 MED ORDER — MISOPROSTOL 25 MCG QUARTER TABLET
25.0000 ug | ORAL_TABLET | Freq: Once | ORAL | Status: AC
  Administered 2022-01-03: 25 ug via ORAL
  Filled 2022-01-03: qty 1

## 2022-01-03 MED ORDER — FLUCONAZOLE 50 MG PO TABS
150.0000 mg | ORAL_TABLET | Freq: Once | ORAL | Status: AC
  Administered 2022-01-03: 150 mg via ORAL
  Filled 2022-01-03: qty 1

## 2022-01-03 MED ORDER — MISOPROSTOL 200 MCG PO TABS
ORAL_TABLET | ORAL | Status: AC
  Filled 2022-01-03: qty 4

## 2022-01-03 MED ORDER — HYDRALAZINE HCL 20 MG/ML IJ SOLN: 10.0000 mg | INTRAMUSCULAR | Status: DC | PRN

## 2022-01-03 MED ORDER — AMMONIA AROMATIC IN INHA
RESPIRATORY_TRACT | Status: AC
  Filled 2022-01-03: qty 10

## 2022-01-03 MED ORDER — LACTATED RINGERS IV SOLN
500.0000 mL | INTRAVENOUS | Status: DC | PRN
  Administered 2022-01-05: 500 mL via INTRAVENOUS

## 2022-01-03 MED ORDER — SODIUM CHLORIDE 0.9 % IV SOLN: 1.0000 g | INTRAVENOUS | Status: DC

## 2022-01-03 MED ORDER — ONDANSETRON HCL 4 MG/2ML IJ SOLN: 4.0000 mg | Freq: Four times a day (QID) | INTRAMUSCULAR | Status: DC | PRN

## 2022-01-03 MED ORDER — OXYTOCIN-SODIUM CHLORIDE 30-0.9 UT/500ML-% IV SOLN
2.5000 [IU]/h | INTRAVENOUS | Status: DC
  Filled 2022-01-03: qty 500

## 2022-01-03 MED ORDER — ACETAMINOPHEN 325 MG PO TABS: 650.0000 mg | ORAL_TABLET | ORAL | Status: DC | PRN

## 2022-01-03 MED ORDER — SOD CITRATE-CITRIC ACID 500-334 MG/5ML PO SOLN: 30.0000 mL | ORAL | Status: DC | PRN

## 2022-01-03 MED ORDER — LIDOCAINE HCL (PF) 1 % IJ SOLN
INTRAMUSCULAR | Status: AC
  Filled 2022-01-03: qty 30

## 2022-01-03 MED ORDER — MISOPROSTOL 50MCG HALF TABLET
50.0000 ug | ORAL_TABLET | Freq: Once | ORAL | Status: AC
  Administered 2022-01-03: 50 ug via VAGINAL
  Filled 2022-01-03: qty 1

## 2022-01-03 MED ORDER — LABETALOL HCL 5 MG/ML IV SOLN: 20.0000 mg | INTRAVENOUS | Status: DC | PRN

## 2022-01-03 MED ORDER — SODIUM CHLORIDE 0.9 % IV SOLN: 2.0000 g | Freq: Once | INTRAVENOUS | Status: DC

## 2022-01-03 MED ORDER — LABETALOL HCL 5 MG/ML IV SOLN: 40.0000 mg | INTRAVENOUS | Status: DC | PRN

## 2022-01-03 MED ORDER — OXYTOCIN 10 UNIT/ML IJ SOLN
INTRAMUSCULAR | Status: AC
  Filled 2022-01-03: qty 2

## 2022-01-03 MED ORDER — MISOPROSTOL 50MCG HALF TABLET: 50.0000 ug | ORAL_TABLET | ORAL | Status: DC | PRN

## 2022-01-03 MED ORDER — ACETAMINOPHEN 325 MG PO TABS
650.0000 mg | ORAL_TABLET | ORAL | Status: DC | PRN
  Administered 2022-01-03 – 2022-01-04 (×2): 650 mg via ORAL
  Filled 2022-01-03 (×2): qty 2

## 2022-01-03 MED ORDER — LABETALOL HCL 5 MG/ML IV SOLN: 80.0000 mg | INTRAVENOUS | Status: DC | PRN

## 2022-01-03 NOTE — OB Triage Note (Addendum)
LABOR & DELIVERY OB TRIAGE NOTE  SUBJECTIVE  HPI Lori Smith is a 36 y.o. G2P1001 at [redacted]w[redacted]d who presents to Labor & Delivery for leaking of fluid. She reports that she had a small gush of clear, blood-tinged fluid around 1100. She has been having occasional mild contractions. She denies recent IC. She endorses good fetal movement. She denies itching or odor.  OB History     Gravida  2   Para  1   Term  1   Preterm  0   AB  0   Living  1      SAB  0   IAB  0   Ectopic  0   Multiple  0   Live Births  1        Obstetric Comments  Hx prolonged ROM with chorio, maternal exhaustion, forceps with third degree laceration and pp hemorrhage         Scheduled Meds: Continuous Infusions: PRN Meds:.acetaminophen  OBJECTIVE  Ht 5' 1.02" (1.55 m)   Wt 84.4 kg   LMP 03/24/2021 (Exact Date)   BMI 35.12 kg/m   Abdomen: soft, gravid, non-tender Cervical exam: 1/40/-3 Small amount of pinkish fluid and white, chunky discharge NST I reviewed the NST and it was reactive.  Baseline: 130 Variability: moderate Accelerations: present Decelerations: none Toco: irregular, q 2-8 minutes Category 1  ROM Plus: Negative Wet Prep: +yeast  ASSESSMENT Impression  1) Pregnancy at G2P1001, [redacted]w[redacted]d, Estimated Date of Delivery: 12/29/21 2) Reassuring maternal/fetal status 3) Vaginal candidiasis   PLAN 1) Discharge home with standard labor/return precautions 2) Keep scheduled IOL in AM 3) Diflucan 150 mg once now  [name], CNM 01/03/22  *ADDENDUM: see progress note and H&P. Admitted for IOL for preeclampsia.

## 2022-01-03 NOTE — H&P (Signed)
History and Physical   HPI  Lori Smith is a 36 y.o. G2P1001 at [redacted]w[redacted]d Estimated Date of Delivery: 12/29/21 who is being admitted for induction of labor for newly diagnosed pre-eclampsia. She presented to triage today with c/o LOF. Her membranes were intact, but she was found to have a yeast infection and was treated with Diflucan. She then had several elevated BPs and a PC ratio of 0.71. The decision was made to admit for IOL tonight instead of waiting for scheduled  IOL in AM. She denies HA, visual changes, and epigastric pain.   OB History  OB History  Gravida Para Term Preterm AB Living  2 1 1  0 0 1  SAB IAB Ectopic Multiple Live Births  0 0 0 0 1    # Outcome Date GA Lbr Len/2nd Weight Sex Delivery Anes PTL Lv  2 Current           1 Term 05/13/13 [redacted]w[redacted]d  3856 g F Vag-Spont        Birth Comments: [facility]    Obstetric Comments  Hx prolonged ROM with chorio, maternal exhaustion, forceps with third degree laceration and pp hemorrhage    PROBLEM LIST  Pregnancy complications or risks: Patient Active Problem List   Diagnosis Date Noted   Labor and delivery, indication for care 01/03/2022   Post-dates pregnancy 01/03/2022   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   UTI (urinary tract infection) during pregnancy dx'd [facility] 06/20/21 06/29/2021   Obesity affecting pregnancy BMI=30.9 06/12/2021   Advanced maternal age in multigravida 36 yo 06/12/2021   Supervision of high-risk pregnancy of elderly multigravida, first trimester 06/12/2021   Advanced paternal age 54 06/12/2021   History of pp hemorrhage EBL: 500 cc 05/13/13 06/12/2021   Abnormal glucose 1 hour glucola=140 on 06/12/21 06/12/2021    Polycystic ovarian disease dx'd age 36 09/21/2019    Prenatal labs and studies: ABO, Rh: --/--/PENDING (12/28 1825) Antibody: PENDING (12/28 1825) Rubella:   RPR: Non Reactive (09/29 0000)  HBsAg: Negative (06/06 1011)  HIV:    OIZ:[id number]/-- (11/28 1548)   Past Medical History:   Diagnosis Date   Advanced paternal age 36 06/12/2021   Migraines    Weight gain      Past Surgical History:  Procedure Laterality Date   Denies surgical history     NO PAST SURGERIES       Medications    Current Discharge Medication List     CONTINUE these medications which have NOT CHANGED   Details  Prenatal Vit-Fe Fumarate-FA (PRENATAL VITAMINS) 28-0.8 MG TABS Take 1 tablet by mouth daily. Qty: 100 tablet, Refills: 0   Associated Diagnoses: Supervision of high-risk pregnancy of elderly multigravida, first trimester         Allergies  Patient has no known allergies.  Review of Systems  Constitutional: negative Ears, nose, mouth, throat, and face: positive for headache Respiratory: negative Cardiovascular: negative Gastrointestinal: negative Genitourinary:positive for vaginal discharge Integument/breast: negative Behavioral/Psych: negative  Physical Exam  BP 132/79   Pulse 62   Temp 98.3 F (36.8 C) (Oral)   Resp 18   Ht 5' 1.02" (1.55 m)   Wt 84.4 kg   LMP 03/24/2021 (Exact Date)   BMI 35.12 kg/m   Lungs:  CTAB Cardio: RRR  Abd: Soft, gravid, mildly tender to palpation Presentation: cephalic  DTRs: 2+, no clonus CERVIX: Dilation: 1 Effacement (%): 40 Cervical Position: Posterior Station: -3 Presentation: Vertex Exam by:: Quitman Livings CNM  See Prenatal records for more  detailed PE.     FHR:  Baseline: 130 Variability: moderate Accelerations: present Decelerations:  none Toco: irregular, every 2-8 minutes Category 1  Test Results  Results for orders placed or performed during the hospital encounter of 01/03/22 (from the past 24 hour(s))  ROM Plus ([facility] only)     Status: None   Collection Time: 01/03/22  2:43 PM  Result Value Ref Range   Rom Plus NEGATIVE   Wet prep, genital     Status: Abnormal   Collection Time: 01/03/22  2:43 PM  Result Value Ref Range   Yeast Wet Prep HPF POC PRESENT (A) NONE SEEN   Trich, Wet Prep NONE  SEEN NONE SEEN   Clue Cells Wet Prep HPF POC NONE SEEN NONE SEEN   WBC, Wet Prep HPF POC >=10 (A) <10   Sperm NONE SEEN   Urinalysis, Complete w Microscopic     Status: Abnormal   Collection Time: 01/03/22  2:43 PM  Result Value Ref Range   Color, Urine YELLOW (A) YELLOW   APPearance CLEAR (A) CLEAR   Specific Gravity, Urine 1.006 1.005 - 1.030   pH 6.0 5.0 - 8.0   Glucose, UA NEGATIVE NEGATIVE mg/dL   Hgb urine dipstick MODERATE (A) NEGATIVE   Bilirubin Urine NEGATIVE NEGATIVE   Ketones, ur NEGATIVE NEGATIVE mg/dL   Protein, ur 30 (A) NEGATIVE mg/dL   Nitrite NEGATIVE NEGATIVE   Leukocytes,Ua SMALL (A) NEGATIVE   RBC / HPF 0-5 0 - 5 RBC/hpf   WBC, UA 6-10 0 - 5 WBC/hpf   Bacteria, UA RARE (A) NONE SEEN   Squamous Epithelial / LPF 0-5 0 - 5 /HPF   Mucus PRESENT   Protein / creatinine ratio, urine     Status: Abnormal   Collection Time: 01/03/22  2:43 PM  Result Value Ref Range   Creatinine, Urine 42 mg/dL   Total Protein, Urine 30 mg/dL   Protein Creatinine Ratio 0.71 (H) 0.00 - 0.15 mg/mg[Cre]  CBC     Status: None   Collection Time: 01/03/22  6:25 PM  Result Value Ref Range   WBC 9.9 4.0 - 10.5 K/uL   RBC 4.22 3.87 - 5.11 MIL/uL   Hemoglobin 12.8 12.0 - 15.0 g/dL   HCT 37.4 36.0 - 46.0 %   MCV 88.6 80.0 - 100.0 fL   MCH 30.3 26.0 - 34.0 pg   MCHC 34.2 30.0 - 36.0 g/dL   RDW 12.8 11.5 - 15.5 %   Platelets 262 150 - 400 K/uL   nRBC 0.0 0.0 - 0.2 %  Comprehensive metabolic panel     Status: Abnormal   Collection Time: 01/03/22  6:25 PM  Result Value Ref Range   Sodium 136 135 - 145 mmol/L   Potassium 3.8 3.5 - 5.1 mmol/L   Chloride 110 98 - 111 mmol/L   CO2 17 (L) 22 - 32 mmol/L   Glucose, Bld 89 70 - 99 mg/dL   BUN 9 6 - 20 mg/dL   Creatinine, Ser 0.50 0.44 - 1.00 mg/dL   Calcium 8.8 (L) 8.9 - 10.3 mg/dL   Total Protein 6.7 6.5 - 8.1 g/dL   Albumin 2.7 (L) 3.5 - 5.0 g/dL   AST 26 15 - 41 U/L   ALT 19 0 - 44 U/L   Alkaline Phosphatase 231 (H) 38 - 126 U/L    Total Bilirubin 0.6 0.3 - 1.2 mg/dL   GFR, Estimated >60 >60 mL/min   Anion gap 9 5 - 15  Type and screen [facility]     Status: None (Preliminary result)   Collection Time: 01/03/22  6:25 PM  Result Value Ref Range   ABO/RH(D) PENDING    Antibody Screen PENDING    Sample Expiration      01/06/2022,2359 Performed at [facility], [address], [place], [place] [address]    Group B Strep negative  Assessment   G2P1001 at [redacted]w[redacted]d Estimated Date of Delivery: 12/29/21  Reassuring maternal/fetal status.  Patient Active Problem List   Diagnosis Date Noted   Labor and delivery, indication for care 01/03/2022   Post-dates pregnancy 01/03/2022   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   UTI (urinary tract infection) during pregnancy dx'd [facility] 06/20/21 06/29/2021   Obesity affecting pregnancy BMI=30.9 06/12/2021   Advanced maternal age in multigravida 36 yo 06/12/2021   Supervision of high-risk pregnancy of elderly multigravida, first trimester 06/12/2021   Advanced paternal age 62 06/12/2021   History of pp hemorrhage EBL: 500 cc 05/13/13 06/12/2021   Abnormal glucose 1 hour glucola=140 on 06/12/21 06/12/2021    Polycystic ovarian disease dx'd age 45 09/21/2019    Plan  1. Admit to L&D for cervical ripening and IOL. Will place misoprostol and Cook catheter. 2. EFM: Continuous -- Category 1 3. Pharmacologic pain relief if desired.   4. Admission labs  5. Preeclampsia protocol 6. Anticipate NSVD 7. Dr. Valentino Saxon notified of admission  [name], [facility] 01/03/2022 7:28 PM

## 2022-01-03 NOTE — Progress Notes (Incomplete)
Pt presents to L/D triage with complaints of leaking of pink-tinged fluid that

## 2022-01-03 NOTE — OB Triage Note (Signed)
Pt discharged home per order.First dose of Diflucan given- pt educated.    Pt stable and ambulatory and an After Visit Summary was printed and given to the patient. Discharge education completed with patient/family including follow up instructions, appointments, and medication list.  Patient able to verbalize understanding, all questions fully answered upon discharge. Patient instructed to return to ED, call 911, or call MD for any changes in condition.  Pt scheduled for 0800 IOL.

## 2022-01-03 NOTE — Progress Notes (Signed)
PROGRESS NOTE  Karen Huhta was noted to have elevated BPs as she was preparing for discharge and reported a headache that has lasted all day. P/C ratio was 0.71. CBC, AST, ALT WNL. Discussed plan of care with Dr. Valentino Saxon. Will proceed with IOL this evening. I reviewed with plan with Lam and her partner via the interpreter, and they are in agreement with IOL tonight with misoprostol and Foley bulb.  [name], CNM 01/03/22 1900

## 2022-01-03 NOTE — Progress Notes (Signed)
Pt presents to L/D triage with complaints of leaking of pink-tinged fluid that began at 1130 today. She has noticed mild cramping over the past few days. Pt reports positive fetal movement and no recent intercourse. No urinary symptoms or fetal concerns.  VSS. NST reactive.  CNM at bedside- SVE and swabs collected.

## 2022-01-03 NOTE — Progress Notes (Signed)
Upon giving discharge instructions, and rechecking BP, RN noted increased BP. Cycling q15 min. Pt reports headache since this morning. CNM notified.  Orders for labs placed and patient educated.

## 2022-01-03 NOTE — Progress Notes (Signed)
LABOR NOTE   SUBJECTIVE:   Lori Smith is Smith 36 y.o.  G2P1001  at [redacted]w[redacted]d whose labor is being induced for postdates and preeclampsia without severe features. She has received one dose of misoprostol and is in agreement with plan for [facility] catheter.    Analgesia: Labor support without medications  OBJECTIVE:  BP 137/82   Pulse 67   Temp 98.3 F (36.8 C) (Oral)   Resp 18   Ht 5' 1.02" (1.55 m)   Wt 84.4 kg   LMP 03/24/2021 (Exact Date)   BMI 35.12 kg/m  No intake/output data recorded.  SVE:   Dilation: 1.5 Effacement (%): 50 Station: -3 Exam by:: Lori A RN CONTRACTIONS: irregular, every 3-4 minutes FHR: Fetal heart tracing reviewed. Baseline: 135 Variability: moderate, minimal Accelerations: present Decelerations:none Category 1/2  Labs: Lab Results  Component Value Date   WBC 9.9 01/03/2022   HGB 12.8 01/03/2022   HCT 37.4 01/03/2022   MCV 88.6 01/03/2022   PLT 262 01/03/2022    ASSESSMENT: 1)  Cervical ripening     Coping: well     Membranes: intact      Principal Problem:   Labor and delivery, indication for care Active Problems:   Post-dates pregnancy   PLAN: Cook catheter placed Consider Pitocin/AROM when appropriate Anticipate NSVD Lori Smith updated  Lori Smith, CNM 01/03/2022 9:17 PM

## 2022-01-04 ENCOUNTER — Inpatient Hospital Stay: Payer: Medicaid Other | Admitting: General Practice

## 2022-01-04 ENCOUNTER — Encounter
Admission: EM | Disposition: A | Discharge status: Home / Self Care | Payer: Self-pay | Source: Home / Self Care | Attending: Advanced Practice Midwife

## 2022-01-04 LAB — URINE CULTURE: Culture: NO GROWTH

## 2022-01-04 LAB — RPR: RPR Ser Ql: NONREACTIVE

## 2022-01-04 SURGERY — Surgical Case
Anesthesia: General

## 2022-01-04 MED ORDER — ACETAMINOPHEN 10 MG/ML IV SOLN
INTRAVENOUS | Status: AC
  Filled 2022-01-04: qty 100

## 2022-01-04 MED ORDER — SODIUM CHLORIDE 0.9 % IV SOLN
500.0000 mg | INTRAVENOUS | Status: AC
  Administered 2022-01-04: 500 mg via INTRAVENOUS
  Filled 2022-01-04: qty 5

## 2022-01-04 MED ORDER — PROPOFOL 10 MG/ML IV BOLUS
INTRAVENOUS | Status: AC
  Filled 2022-01-04: qty 20

## 2022-01-04 MED ORDER — TERBUTALINE SULFATE 1 MG/ML IJ SOLN: 0.2500 mg | Freq: Once | INTRAMUSCULAR | Status: DC | PRN

## 2022-01-04 MED ORDER — MORPHINE SULFATE (PF) 0.5 MG/ML IJ SOLN
INTRAMUSCULAR | Status: AC
  Filled 2022-01-04: qty 10

## 2022-01-04 MED ORDER — BUPIVACAINE HCL (PF) 0.5 % IJ SOLN
INTRAMUSCULAR | Status: AC
  Filled 2022-01-04: qty 30

## 2022-01-04 MED ORDER — CLINDAMYCIN PHOSPHATE 900 MG/50ML IV SOLN
INTRAVENOUS | Status: AC
  Administered 2022-01-05: 900 mg via INTRAVENOUS
  Filled 2022-01-04: qty 50

## 2022-01-04 MED ORDER — DEXAMETHASONE SODIUM PHOSPHATE 10 MG/ML IJ SOLN
INTRAMUSCULAR | Status: AC
  Filled 2022-01-04: qty 1

## 2022-01-04 MED ORDER — SOD CITRATE-CITRIC ACID 500-334 MG/5ML PO SOLN: 30.0000 mL | ORAL | Status: DC

## 2022-01-04 MED ORDER — BUPIVACAINE HCL (PF) 0.25 % IJ SOLN
INTRAMUSCULAR | Status: AC
  Filled 2022-01-04: qty 30

## 2022-01-04 MED ORDER — CEFAZOLIN SODIUM-DEXTROSE 2-4 GM/100ML-% IV SOLN
2.0000 g | INTRAVENOUS | Status: AC
  Administered 2022-01-04: 2 g via INTRAVENOUS
  Filled 2022-01-04: qty 100

## 2022-01-04 MED ORDER — EPHEDRINE 5 MG/ML INJ
INTRAVENOUS | Status: AC
  Filled 2022-01-04: qty 5

## 2022-01-04 MED ORDER — FENTANYL CITRATE (PF) 100 MCG/2ML IJ SOLN
INTRAMUSCULAR | Status: AC
  Filled 2022-01-04: qty 2

## 2022-01-04 MED ORDER — PHENYLEPHRINE HCL-NACL 20-0.9 MG/250ML-% IV SOLN
INTRAVENOUS | Status: AC
  Filled 2022-01-04: qty 250

## 2022-01-04 MED ORDER — PHENYLEPHRINE 80 MCG/ML (10ML) SYRINGE FOR IV PUSH (FOR BLOOD PRESSURE SUPPORT)
PREFILLED_SYRINGE | INTRAVENOUS | Status: AC
  Filled 2022-01-04: qty 10

## 2022-01-04 MED ORDER — OXYTOCIN-SODIUM CHLORIDE 30-0.9 UT/500ML-% IV SOLN
1.0000 m[IU]/min | INTRAVENOUS | Status: DC
  Administered 2022-01-04: 2 m[IU]/min via INTRAVENOUS
  Filled 2022-01-04: qty 500

## 2022-01-04 MED ORDER — ONDANSETRON HCL 4 MG/2ML IJ SOLN
INTRAMUSCULAR | Status: AC
  Filled 2022-01-04: qty 2

## 2022-01-04 MED ORDER — FENTANYL CITRATE (PF) 100 MCG/2ML IJ SOLN
50.0000 ug | Freq: Once | INTRAMUSCULAR | Status: AC
  Administered 2022-01-04: 50 ug via INTRAVENOUS
  Filled 2022-01-04: qty 2

## 2022-01-04 SURGICAL SUPPLY — 27 items
CHLORAPREP W/TINT 26 (MISCELLANEOUS) ×1 IMPLANT
DRSG TELFA 3X8 NADH STRL (GAUZE/BANDAGES/DRESSINGS) ×1 IMPLANT
ELECT REM PT RETURN 9FT ADLT (ELECTROSURGICAL) ×1
ELECTRODE REM PT RTRN 9FT ADLT (ELECTROSURGICAL) ×1 IMPLANT
GAUZE SPONGE 4X4 12PLY STRL (GAUZE/BANDAGES/DRESSINGS) ×1 IMPLANT
GOWN STRL REUS W/ TWL LRG LVL3 (GOWN DISPOSABLE) ×3 IMPLANT
GOWN STRL REUS W/TWL LRG LVL3 (GOWN DISPOSABLE) ×3
MANIFOLD NEPTUNE II (INSTRUMENTS) ×1 IMPLANT
MAT PREVALON FULL STRYKER (MISCELLANEOUS) ×1 IMPLANT
NDL HYPO 25GX1X1/2 BEV (NEEDLE) ×1 IMPLANT
NEEDLE HYPO 25GX1X1/2 BEV (NEEDLE) ×1 IMPLANT
NS IRRIG 1000ML POUR BTL (IV SOLUTION) ×1 IMPLANT
PACK C SECTION AR (MISCELLANEOUS) ×1 IMPLANT
PAD OB MATERNITY 4.3X12.25 (PERSONAL CARE ITEMS) ×1 IMPLANT
PAD PREP 24X41 OB/GYN DISP (PERSONAL CARE ITEMS) ×1 IMPLANT
SCRUB CHG 4% DYNA-HEX 4OZ (MISCELLANEOUS) ×1 IMPLANT
SUT MNCRL 4-0 (SUTURE) ×1
SUT MNCRL 4-0 27XMFL (SUTURE) ×1
SUT VIC AB 0 CT1 36 (SUTURE) ×2 IMPLANT
SUT VIC AB 0 CTX 36 (SUTURE) ×2
SUT VIC AB 0 CTX36XBRD ANBCTRL (SUTURE) ×2 IMPLANT
SUT VIC AB 2-0 SH 27 (SUTURE) ×2
SUT VIC AB 2-0 SH 27XBRD (SUTURE) ×2 IMPLANT
SUTURE MNCRL 4-0 27XMF (SUTURE) ×1 IMPLANT
SYR 30ML LL (SYRINGE) ×2 IMPLANT
TRAP FLUID SMOKE EVACUATOR (MISCELLANEOUS) ×1 IMPLANT
WATER STERILE IRR 500ML POUR (IV SOLUTION) ×1 IMPLANT

## 2022-01-04 NOTE — Anesthesia Preprocedure Evaluation (Signed)
Anesthesia Evaluation  Patient identified by MRN, date of birth, ID band Patient awake    Reviewed: Allergy & Precautions, NPO status , Patient's Chart, lab work & pertinent test results  Airway Mallampati: III  TM Distance: >3 FB Neck ROM: full    Dental  (+) Chipped   Pulmonary neg pulmonary ROS, neg COPD   Pulmonary exam normal        Cardiovascular Exercise Tolerance: Good hypertension, (-) Past MI and (-) CABG negative cardio ROS Normal cardiovascular exam     Neuro/Psych    GI/Hepatic negative GI ROS,,,  Endo/Other    Renal/GU   negative genitourinary   Musculoskeletal   Abdominal   Peds  Hematology negative hematology ROS (+)   Anesthesia Other Findings Past Medical History: 06/12/2021: Advanced paternal age 89 No date: Migraines 01/03/2022: Preeclampsia No date: Weight gain  Past Surgical History: No date: Denies surgical history No date: NO PAST SURGERIES  BMI    Body Mass Index: 35.12 kg/m      Reproductive/Obstetrics (+) Pregnancy                             Anesthesia Physical Anesthesia Plan  ASA: 2  Anesthesia Plan: Spinal   Post-op Pain Management:    Induction:   PONV Risk Score and Plan:   Airway Management Planned: Natural Airway and Nasal Cannula  Additional Equipment:   Intra-op Plan:   Post-operative Plan:   Informed Consent: I have reviewed the patients History and Physical, chart, labs and discussed the procedure including the risks, benefits and alternatives for the proposed anesthesia with the patient or authorized representative who has indicated his/her understanding and acceptance.     Dental Advisory Given  Plan Discussed with: Anesthesiologist, CRNA and Surgeon  Anesthesia Plan Comments: (Patient reports no bleeding problems and no anticoagulant use.  Plan for spinal with backup GA  Patient consented for risks of anesthesia  including but not limited to:  - adverse reactions to medications - damage to eyes, teeth, lips or other oral mucosa - nerve damage due to positioning  - risk of bleeding, infection and or nerve damage from spinal that could lead to paralysis - risk of headache or failed spinal - damage to teeth, lips or other oral mucosa - sore throat or hoarseness - damage to heart, brain, nerves, lungs, other parts of body or loss of life  Patient voiced understanding.)       Anesthesia Quick Evaluation

## 2022-01-04 NOTE — Progress Notes (Signed)
  Labor Progress Note   36 y.o. G2P1001 @ [redacted]w[redacted]d , admitted for  Pregnancy, Labor Management. IOL preeclampsia  Subjective:  Coping well with contractions. She has declined epidural  Objective:  BP 139/75   Pulse 62   Temp 98.5 F (36.9 C) (Oral)   Resp 16   Ht 5' 1.02" (1.55 m)   Wt 84.4 kg   LMP 03/24/2021 (Exact Date)   BMI 35.12 kg/m  Abd: gravid, ND, FHT present, mild tenderness on exam Extr: no edema SVE: CERVIX: 8 cm dilated, 80 effaced, -2 station  EFM: FHR: 155 bpm, variability: moderate,  accelerations:  Absent,  decelerations:  Absent Toco: Frequency: Every 1-3 minutes Labs: I have reviewed the patient's lab results.   Assessment & Plan:  G2P1001 @ [redacted]w[redacted]d, admitted for  Pregnancy and Labor/Delivery Management  1. Pain management:  position changes . 2. FWB: FHT category I/II.  3. ID: GBS negative 4. Labor management: continue pitocin titration  All discussed with patient, see orders   Tresea Mall, CNM [place] [facility] 01/04/2022  3:49 PM

## 2022-01-04 NOTE — Progress Notes (Addendum)
  Labor Progress Note   36 y.o. G2P1001 @ [redacted]w[redacted]d , admitted for  Pregnancy, Labor Management. IOL preeclampsia  Subjective:  Continues to cope well with contractions  Objective:  BP 139/71   Pulse 66   Temp 98.7 F (37.3 C) (Oral)   Resp 16   Ht 5' 1.02" (1.55 m)   Wt 84.4 kg   LMP 03/24/2021 (Exact Date)   BMI 35.12 kg/m  Abd: gravid, ND, FHT present, mild tenderness on exam Extr: no edema SVE: CERVIX: 8 cm dilated, 80 effaced, -2 station, feels warm internally Position changes   EFM: FHR: 145 bpm, variability: minimal to moderate,  accelerations:  Absent,  decelerations:  Present variable/late Toco: Frequency: Every 2-3 minutes Labs: I have reviewed the patient's lab results.   Assessment & Plan:  G2P1001 @ [redacted]w[redacted]d, admitted for  Pregnancy and Labor/Delivery Management  1. Pain management:  position changes . 2. FWB: FHT category II.  3. ID: GBS negative 4. Labor management: continue pitocin titration  All discussed with patient, see orders   Tresea Mall, CNM [place] [facility] 01/04/2022  6:31 PM

## 2022-01-04 NOTE — Progress Notes (Signed)
LABOR NOTE   SUBJECTIVE:   Lori Smith is a 36 y.o.  G2P1001  at [redacted]w[redacted]d whose labor is being induced for postdates and preeclampsia. Her Cook catheter was expelled at 0213, and she is receiving Pitocin at 10 mU/min. She has had periods of minimal variability with occasional late decelerations. She is having contractions that palpate moderate q 2-3 minutes.  Analgesia: Labor support without medications  OBJECTIVE:  BP 135/80   Pulse 61   Temp 99 F (37.2 C) (Oral)   Resp 18   Ht 5' 1.02" (1.55 m)   Wt 84.4 kg   LMP 03/24/2021 (Exact Date)   BMI 35.12 kg/m  No intake/output data recorded.  SVE:   Dilation: 4 Effacement (%): 50 Station: -3 Exam by:: Quitman Livings. CNM CONTRACTIONS: regular, every 2-6 minutes FHR: Fetal heart tracing reviewed. Baseline:  Variability: moderate, minimal Accelerations: not present Decelerations:late Category 2  Labs: Lab Results  Component Value Date   WBC 9.9 01/03/2022   HGB 12.8 01/03/2022   HCT 37.4 01/03/2022   MCV 88.6 01/03/2022   PLT 262 01/03/2022    ASSESSMENT: 1)  Induction of labor,      Coping: well     Membranes: intact  Principal Problem:   Labor and delivery, indication for care Active Problems:   Post-dates pregnancy   Preeclampsia   PLAN: Pitocin turned down by half Consulted Dr. Valentino Saxon to review fetal monitoring strip. Her recommendation is to AROM and continue Pitocin augmentation. Anticipate NSVD   [name], CNM 01/04/2022 5:55 AM

## 2022-01-04 NOTE — Progress Notes (Signed)
  Labor Progress Note   36 y.o. G2P1001 @ [redacted]w[redacted]d , admitted for  Pregnancy, Labor Management. IOL preeclampsia  Subjective:  More comfortable now with IV pain medicine  Objective:  BP (!) 142/84   Pulse (!) 54   Temp 99.3 F (37.4 C) (Oral)   Resp 16   Ht 5' 1.02" (1.55 m)   Wt 84.4 kg   LMP 03/24/2021 (Exact Date)   BMI 35.12 kg/m  Abd: gravid, ND, FHT present, mild tenderness on exam Extr: no edema SVE: CERVIX: 8 cm dilated, 80 effaced, -2 station  EFM: FHR: 150 bpm, variability: minimal to moderate,  accelerations: absent,  decelerations:  Present  late Toco: Frequency: Every 2-3 minutes Labs: I have reviewed the patient's lab results.   Assessment & Plan:  G2P1001 @ [redacted]w[redacted]d, admitted for  Pregnancy and Labor/Delivery Management  1. Pain management:  IV analgesia . 2. FWB: FHT category II.  3. ID: GBS negative 4. Labor management: Discussed no cervical change with Dr Dalbert Garnet and category II tracing. We will discuss possibility of c/section with the patient using interpreter  All discussed with patient, see orders   Tresea Mall, CNM [place] [facility] 01/04/2022  9:39 PM

## 2022-01-04 NOTE — Progress Notes (Signed)
LABOR NOTE   SUBJECTIVE:   Lori Smith is a 36 y.o.  G2P1001  at [redacted]w[redacted]d whose labor is being induced for preeclampsia and postdates. She has had some late decelerations that resolve with position changes.  Analgesia: Labor support without medications  OBJECTIVE:  BP 119/66   Pulse 65   Temp 99 F (37.2 C) (Oral)   Resp 18   Ht 5' 1.02" (1.55 m)   Wt 84.4 kg   LMP 03/24/2021 (Exact Date)   BMI 35.12 kg/m  No intake/output data recorded.  SVE:   Dilation: 1.5 Effacement (%): 50 Station: -3 Exam by:: Katie A RN CONTRACTIONS: irregular, every 3-6 minutes, moderate to palpation FHR: Fetal heart tracing reviewed. Baseline: 150 Variability: moderate Accelerations: Decelerations:late Category 2  Labs: Lab Results  Component Value Date   WBC 9.9 01/03/2022   HGB 12.8 01/03/2022   HCT 37.4 01/03/2022   MCV 88.6 01/03/2022   PLT 262 01/03/2022    ASSESSMENT: 1)  Cervical ripening. Cook catheter in place.     Coping: well     Membranes: intact     Category 2 tracing     Normotensive      Principal Problem:   Labor and delivery, indication for care Active Problems:   Post-dates pregnancy   Preeclampsia   PLAN: Continue cervical ripening.  Redose misoprostol when appropriate Anticipate NSVD  [name], CNM 01/04/2022 12:32 AM

## 2022-01-04 NOTE — Progress Notes (Signed)
PROGRESS NOTE  Discussed concerns with Dynisha via interpreter. She is in agreement with plan for AROM and IUPC. AROM for thick meconium. IUPC placed.Continues with Category 2 tracing. Plan to titrate Pitocin to adequate MVUs. Dr. Valentino Saxon updated.  [name], CNM 01/04/22 7:23 AM

## 2022-01-04 NOTE — Progress Notes (Signed)
Assuming patient care:   36yo G2P1001 at [redacted]w[redacted]d with iol for PreE with elevated BP on arrival and proteinuria. No severe features. She has not progressed - she has been 7-8 cm since 1046 this morning on pitocin, and has not descended into the pelvis. Particularly, the fht are Cat II without improvement. She has long periods of minimal variability, and rare accels with occasional late decels. I am concerned for fetal well being.  After discussion with patient and partner with interpreter, decision to deliver by pLTCS was made.   The risks of cesarean section discussed with the patient included but were not limited to: bleeding which may require transfusion or reoperation; infection which may require antibiotics; injury to bowel, bladder, ureters or other surrounding organs; injury to the fetus; need for additional procedures including hysterectomy in the event of a life-threatening hemorrhage; placental abnormalities wth subsequent pregnancies, incisional problems, thromboembolic phenomenon and other postoperative/anesthesia complications. The patient concurred with the proposed plan, giving informed written consent for the procedure.  Anesthesia and OR aware. Preoperative prophylactic antibiotics and SCDs ordered on call to the OR.  To OR when ready.

## 2022-01-04 NOTE — Progress Notes (Signed)
  Labor Progress Note   36 y.o. G2P1001 @ [redacted]w[redacted]d , admitted for  Pregnancy, Labor Management. IOL preeclampsia  Subjective:  Coping well with contractions  Objective:  BP 128/63   Pulse (!) 57   Temp 98.4 F (36.9 C) (Oral)   Resp 16   Ht 5' 1.02" (1.55 m)   Wt 84.4 kg   LMP 03/24/2021 (Exact Date)   BMI 35.12 kg/m  Abd: gravid, ND, FHT present, mild tenderness on exam Extr: no edema SVE: CERVIX: 7 cm dilated, 70 effaced, -2 station Meconium stained fuid  EFM: FHR: 155 bpm, variability: minimal to moderate,  accelerations:  Absent,  decelerations:  Absent Toco: Frequency: Every 2-3 minutes Labs: I have reviewed the patient's lab results.   Assessment & Plan:  G2P1001 @ [redacted]w[redacted]d, admitted for  Pregnancy and Labor/Delivery Management  1. Pain management:  position changes . 2. FWB: FHT category I/II.  3. ID: GBS negative 4. Labor management: continue pitocin titration  All discussed with patient, see orders   Tresea Mall, CNM [place] [facility] 01/04/2022  10:54 AM

## 2022-01-05 ENCOUNTER — Other Ambulatory Visit: Payer: Self-pay

## 2022-01-05 ENCOUNTER — Encounter: Payer: Self-pay | Admitting: Obstetrics

## 2022-01-05 LAB — PREPARE RBC (CROSSMATCH)

## 2022-01-05 LAB — BASIC METABOLIC PANEL
Anion gap: 9 (ref 5–15)
BUN: 14 mg/dL (ref 6–20)
CO2: 17 mmol/L — ABNORMAL LOW (ref 22–32)
Calcium: 7.2 mg/dL — ABNORMAL LOW (ref 8.9–10.3)
Chloride: 107 mmol/L (ref 98–111)
Creatinine, Ser: 0.58 mg/dL (ref 0.44–1.00)
GFR, Estimated: >60 mL/min (ref >60)
Glucose, Bld: 128 mg/dL — ABNORMAL HIGH (ref 70–99)
Potassium: 4.3 mmol/L (ref 3.5–5.1)
Sodium: 133 mmol/L — ABNORMAL LOW (ref 135–145)

## 2022-01-05 LAB — COMPREHENSIVE METABOLIC PANEL
ALT: 11 U/L (ref 0–44)
AST: 26 U/L (ref 15–41)
Albumin: 1.8 g/dL — ABNORMAL LOW (ref 3.5–5.0)
Alkaline Phosphatase: 164 U/L — ABNORMAL HIGH (ref 38–126)
Anion gap: 5 (ref 5–15)
BUN: 10 mg/dL (ref 6–20)
CO2: 16 mmol/L — ABNORMAL LOW (ref 22–32)
Calcium: 6.8 mg/dL — ABNORMAL LOW (ref 8.9–10.3)
Chloride: 112 mmol/L — ABNORMAL HIGH (ref 98–111)
Creatinine, Ser: 0.66 mg/dL (ref 0.44–1.00)
GFR, Estimated: >60 mL/min (ref >60)
Glucose, Bld: 118 mg/dL — ABNORMAL HIGH (ref 70–99)
Potassium: 3.9 mmol/L (ref 3.5–5.1)
Sodium: 133 mmol/L — ABNORMAL LOW (ref 135–145)
Total Bilirubin: 0.8 mg/dL (ref 0.3–1.2)
Total Protein: 4.7 g/dL — ABNORMAL LOW (ref 6.5–8.1)

## 2022-01-05 LAB — CBC
HCT: 28.4 % — ABNORMAL LOW (ref 36.0–46.0)
HCT: 32.6 % — ABNORMAL LOW (ref 36.0–46.0)
Hemoglobin: 9.3 g/dL — ABNORMAL LOW (ref 12.0–15.0)
Hemoglobin: 10.7 g/dL — ABNORMAL LOW (ref 12.0–15.0)
MCH: 29.7 pg (ref 26.0–34.0)
MCH: 30 pg (ref 26.0–34.0)
MCHC: 32.7 g/dL (ref 30.0–36.0)
MCHC: 32.8 g/dL (ref 30.0–36.0)
MCV: 90.7 fL (ref 80.0–100.0)
MCV: 91.3 fL (ref 80.0–100.0)
Platelets: 206 10*3/uL (ref 150–400)
Platelets: 167 10*3/uL (ref 150–400)
RBC: 3.13 MIL/uL — ABNORMAL LOW (ref 3.87–5.11)
RBC: 3.57 MIL/uL — ABNORMAL LOW (ref 3.87–5.11)
RDW: 13 % (ref 11.5–15.5)
RDW: 13.7 % (ref 11.5–15.5)
WBC: 15.5 10*3/uL — ABNORMAL HIGH (ref 4.0–10.5)
WBC: 24.6 10*3/uL — ABNORMAL HIGH (ref 4.0–10.5)
nRBC: 0 % (ref 0.0–0.2)
nRBC: 0 % (ref 0.0–0.2)

## 2022-01-05 MED ORDER — HYDROMORPHONE HCL 1 MG/ML IJ SOLN: 0.5000 mg | INTRAMUSCULAR | Status: DC | PRN

## 2022-01-05 MED ORDER — ACETAMINOPHEN 10 MG/ML IV SOLN
INTRAVENOUS | Status: DC | PRN
  Administered 2022-01-04: 1000 mg via INTRAVENOUS

## 2022-01-05 MED ORDER — COCONUT OIL OIL: 1.0000 | TOPICAL_OIL | Status: DC | PRN

## 2022-01-05 MED ORDER — IBUPROFEN 600 MG PO TABS: 600.0000 mg | ORAL_TABLET | Freq: Four times a day (QID) | ORAL | Status: DC

## 2022-01-05 MED ORDER — SODIUM CHLORIDE 0.9 % IV SOLN: INTRAVENOUS | Status: DC | PRN

## 2022-01-05 MED ORDER — MENTHOL 3 MG MT LOZG: 1.0000 | LOZENGE | OROMUCOSAL | Status: DC | PRN

## 2022-01-05 MED ORDER — NALOXONE HCL 0.4 MG/ML IJ SOLN
0.4000 mg | INTRAMUSCULAR | Status: DC | PRN
  Filled 2022-01-05: qty 1

## 2022-01-05 MED ORDER — OXYTOCIN-SODIUM CHLORIDE 30-0.9 UT/500ML-% IV SOLN
2.5000 [IU]/h | INTRAVENOUS | Status: AC
  Administered 2022-01-05: 2.5 [IU]/h via INTRAVENOUS
  Filled 2022-01-05: qty 500

## 2022-01-05 MED ORDER — ACETAMINOPHEN 500 MG PO TABS
1000.0000 mg | ORAL_TABLET | Freq: Four times a day (QID) | ORAL | Status: AC
  Administered 2022-01-05 (×3): 1000 mg via ORAL
  Filled 2022-01-05 (×3): qty 2

## 2022-01-05 MED ORDER — HYDROMORPHONE HCL 1 MG/ML IJ SOLN: 1.0000 mg | Freq: Once | INTRAMUSCULAR | Status: AC

## 2022-01-05 MED ORDER — GENTAMICIN SULFATE 40 MG/ML IJ SOLN
5.0000 mg/kg | INTRAVENOUS | Status: DC
  Administered 2022-01-05 – 2022-01-06 (×2): 420 mg via INTRAVENOUS
  Filled 2022-01-05 (×2): qty 10.5

## 2022-01-05 MED ORDER — SCOPOLAMINE 1 MG/3DAYS TD PT72: 1.0000 | MEDICATED_PATCH | Freq: Once | TRANSDERMAL | Status: DC

## 2022-01-05 MED ORDER — ONDANSETRON HCL 4 MG/2ML IJ SOLN
INTRAMUSCULAR | Status: DC | PRN
  Administered 2022-01-04: 4 mg via INTRAVENOUS

## 2022-01-05 MED ORDER — FERROUS SULFATE 325 (65 FE) MG PO TABS
325.0000 mg | ORAL_TABLET | Freq: Two times a day (BID) | ORAL | Status: DC
  Administered 2022-01-05 – 2022-01-09 (×8): 325 mg via ORAL
  Filled 2022-01-05 (×8): qty 1

## 2022-01-05 MED ORDER — MISOPROSTOL 200 MCG PO TABS: 800.0000 ug | ORAL_TABLET | Freq: Once | ORAL | Status: DC

## 2022-01-05 MED ORDER — SIMETHICONE 80 MG PO CHEW
80.0000 mg | CHEWABLE_TABLET | Freq: Three times a day (TID) | ORAL | Status: DC
  Administered 2022-01-05 – 2022-01-09 (×10): 80 mg via ORAL
  Filled 2022-01-05 (×11): qty 1

## 2022-01-05 MED ORDER — SODIUM CHLORIDE 0.9 % IV SOLN
2.0000 g | Freq: Four times a day (QID) | INTRAVENOUS | Status: DC
  Administered 2022-01-05 – 2022-01-06 (×6): 2 g via INTRAVENOUS
  Filled 2022-01-05 (×2): qty 2000
  Filled 2022-01-05: qty 2
  Filled 2022-01-05: qty 2000
  Filled 2022-01-05 (×2): qty 2
  Filled 2022-01-05 (×2): qty 2000

## 2022-01-05 MED ORDER — DEXAMETHASONE SODIUM PHOSPHATE 10 MG/ML IJ SOLN
INTRAMUSCULAR | Status: DC | PRN
  Administered 2022-01-04: 10 mg via INTRAVENOUS

## 2022-01-05 MED ORDER — OXYCODONE HCL 5 MG PO TABS
5.0000 mg | ORAL_TABLET | Freq: Four times a day (QID) | ORAL | Status: DC | PRN
  Administered 2022-01-05 – 2022-01-06 (×3): 5 mg via ORAL
  Filled 2022-01-05 (×3): qty 1

## 2022-01-05 MED ORDER — NALOXONE HCL 4 MG/10ML IJ SOLN
1.0000 ug/kg/h | INTRAVENOUS | Status: DC | PRN
  Filled 2022-01-05: qty 5

## 2022-01-05 MED ORDER — SODIUM CHLORIDE 0.9% IV SOLUTION: Freq: Once | INTRAVENOUS | Status: DC

## 2022-01-05 MED ORDER — OXYCODONE HCL 5 MG PO TABS: 5.0000 mg | ORAL_TABLET | Freq: Four times a day (QID) | ORAL | Status: DC | PRN

## 2022-01-05 MED ORDER — PHENYLEPHRINE HCL-NACL 20-0.9 MG/250ML-% IV SOLN
INTRAVENOUS | Status: DC | PRN
  Administered 2022-01-04: 30 ug/min via INTRAVENOUS

## 2022-01-05 MED ORDER — MEPERIDINE HCL 25 MG/ML IJ SOLN: 6.2500 mg | INTRAMUSCULAR | Status: DC | PRN

## 2022-01-05 MED ORDER — CLINDAMYCIN PHOSPHATE 900 MG/50ML IV SOLN
INTRAVENOUS | Status: DC | PRN
  Administered 2022-01-05: 900 mg via INTRAVENOUS

## 2022-01-05 MED ORDER — FUROSEMIDE 10 MG/ML IJ SOLN
20.0000 mg | Freq: Once | INTRAMUSCULAR | Status: AC
  Administered 2022-01-05: 20 mg via INTRAVENOUS
  Filled 2022-01-05: qty 2

## 2022-01-05 MED ORDER — SUCCINYLCHOLINE CHLORIDE 200 MG/10ML IV SOSY
PREFILLED_SYRINGE | INTRAVENOUS | Status: DC | PRN
  Administered 2022-01-04: 100 mg via INTRAVENOUS

## 2022-01-05 MED ORDER — BISACODYL 10 MG RE SUPP: 10.0000 mg | Freq: Every day | RECTAL | Status: DC | PRN

## 2022-01-05 MED ORDER — TETANUS-DIPHTH-ACELL PERTUSSIS 5-2.5-18.5 LF-MCG/0.5 IM SUSY
0.5000 mL | PREFILLED_SYRINGE | Freq: Once | INTRAMUSCULAR | Status: DC
  Filled 2022-01-05: qty 0.5

## 2022-01-05 MED ORDER — ONDANSETRON HCL 4 MG/2ML IJ SOLN: 4.0000 mg | Freq: Three times a day (TID) | INTRAMUSCULAR | Status: DC | PRN

## 2022-01-05 MED ORDER — MISOPROSTOL 200 MCG PO TABS
800.0000 ug | ORAL_TABLET | Freq: Once | ORAL | Status: AC
  Administered 2022-01-05: 800 ug via RECTAL

## 2022-01-05 MED ORDER — PRENATAL MULTIVITAMIN CH
1.0000 | ORAL_TABLET | Freq: Every day | ORAL | Status: DC
  Administered 2022-01-05 – 2022-01-08 (×3): 1 via ORAL
  Filled 2022-01-05 (×3): qty 1

## 2022-01-05 MED ORDER — CLINDAMYCIN PHOSPHATE 900 MG/50ML IV SOLN
900.0000 mg | Freq: Three times a day (TID) | INTRAVENOUS | Status: DC
  Administered 2022-01-05 – 2022-01-06 (×4): 900 mg via INTRAVENOUS
  Filled 2022-01-05 (×5): qty 50

## 2022-01-05 MED ORDER — MORPHINE SULFATE (PF) 0.5 MG/ML IJ SOLN
INTRAMUSCULAR | Status: DC | PRN
  Administered 2022-01-04: 3 mg via EPIDURAL
  Administered 2022-01-04: 2 mg via EPIDURAL

## 2022-01-05 MED ORDER — GABAPENTIN 300 MG PO CAPS
300.0000 mg | ORAL_CAPSULE | Freq: Every day | ORAL | Status: DC
  Administered 2022-01-05 – 2022-01-08 (×3): 300 mg via ORAL
  Filled 2022-01-05 (×4): qty 1

## 2022-01-05 MED ORDER — SIMETHICONE 80 MG PO CHEW
80.0000 mg | CHEWABLE_TABLET | ORAL | Status: DC | PRN
  Administered 2022-01-08: 80 mg via ORAL
  Filled 2022-01-05: qty 1

## 2022-01-05 MED ORDER — FENTANYL CITRATE (PF) 100 MCG/2ML IJ SOLN
INTRAMUSCULAR | Status: DC | PRN
  Administered 2022-01-04: 100 ug via INTRAVENOUS
  Administered 2022-01-05 (×2): 50 ug via INTRAVENOUS

## 2022-01-05 MED ORDER — WITCH HAZEL-GLYCERIN EX PADS
1.0000 | MEDICATED_PAD | CUTANEOUS | Status: DC | PRN
  Administered 2022-01-07: 1 via TOPICAL
  Filled 2022-01-05 (×2): qty 100

## 2022-01-05 MED ORDER — TRANEXAMIC ACID-NACL 1000-0.7 MG/100ML-% IV SOLN
INTRAVENOUS | Status: AC
  Filled 2022-01-05: qty 100

## 2022-01-05 MED ORDER — KETOROLAC TROMETHAMINE 30 MG/ML IJ SOLN: 30.0000 mg | Freq: Four times a day (QID) | INTRAMUSCULAR | Status: DC | PRN

## 2022-01-05 MED ORDER — KETOROLAC TROMETHAMINE 30 MG/ML IJ SOLN
30.0000 mg | Freq: Four times a day (QID) | INTRAMUSCULAR | Status: DC | PRN
  Administered 2022-01-05: 30 mg via INTRAVENOUS

## 2022-01-05 MED ORDER — MEASLES, MUMPS & RUBELLA VAC IJ SOLR
0.5000 mL | Freq: Once | INTRAMUSCULAR | Status: DC
  Filled 2022-01-05: qty 0.5

## 2022-01-05 MED ORDER — SODIUM CHLORIDE 0.9% FLUSH: 3.0000 mL | INTRAVENOUS | Status: DC | PRN

## 2022-01-05 MED ORDER — LACTATED RINGERS IV SOLN: INTRAVENOUS | Status: DC

## 2022-01-05 MED ORDER — FLEET ENEMA 7-19 GM/118ML RE ENEM: 1.0000 | ENEMA | Freq: Every day | RECTAL | Status: DC | PRN

## 2022-01-05 MED ORDER — LIDOCAINE HCL (CARDIAC) PF 100 MG/5ML IV SOSY
PREFILLED_SYRINGE | INTRAVENOUS | Status: DC | PRN
  Administered 2022-01-04: 100 mg via INTRAVENOUS

## 2022-01-05 MED ORDER — KETOROLAC TROMETHAMINE 30 MG/ML IJ SOLN
30.0000 mg | Freq: Four times a day (QID) | INTRAMUSCULAR | Status: DC
  Filled 2022-01-05: qty 1

## 2022-01-05 MED ORDER — DIBUCAINE (PERIANAL) 1 % EX OINT: 1.0000 | TOPICAL_OINTMENT | CUTANEOUS | Status: DC | PRN

## 2022-01-05 MED ORDER — PROPOFOL 10 MG/ML IV BOLUS
INTRAVENOUS | Status: DC | PRN
  Administered 2022-01-04: 200 mg via INTRAVENOUS

## 2022-01-05 MED ORDER — HYDROMORPHONE 1 MG/ML IV SOLN
INTRAVENOUS | Status: DC
  Administered 2022-01-05: 30 mg via INTRAVENOUS
  Filled 2022-01-05 (×2): qty 30

## 2022-01-05 MED ORDER — DIPHENHYDRAMINE HCL 50 MG/ML IJ SOLN: 12.5000 mg | INTRAMUSCULAR | Status: DC | PRN

## 2022-01-05 MED ORDER — HYDROMORPHONE HCL 1 MG/ML IJ SOLN: 1.0000 mg | INTRAMUSCULAR | Status: DC | PRN

## 2022-01-05 MED ORDER — HYDROMORPHONE HCL 1 MG/ML IJ SOLN
INTRAMUSCULAR | Status: AC
  Administered 2022-01-05: 1 mg via INTRAVENOUS
  Filled 2022-01-05: qty 1

## 2022-01-05 MED ORDER — EPHEDRINE SULFATE (PRESSORS) 50 MG/ML IJ SOLN
INTRAMUSCULAR | Status: DC | PRN
  Administered 2022-01-04: 10 mg via INTRAVENOUS

## 2022-01-05 MED ORDER — OXYCODONE HCL 5 MG PO TABS
10.0000 mg | ORAL_TABLET | Freq: Four times a day (QID) | ORAL | Status: DC | PRN
  Administered 2022-01-05 – 2022-01-06 (×3): 10 mg via ORAL
  Filled 2022-01-05 (×3): qty 2

## 2022-01-05 MED ORDER — PHENYLEPHRINE 80 MCG/ML (10ML) SYRINGE FOR IV PUSH (FOR BLOOD PRESSURE SUPPORT)
PREFILLED_SYRINGE | INTRAVENOUS | Status: DC | PRN
  Administered 2022-01-04 (×2): 160 ug via INTRAVENOUS
  Administered 2022-01-04: 80 ug via INTRAVENOUS

## 2022-01-05 MED ORDER — SENNOSIDES-DOCUSATE SODIUM 8.6-50 MG PO TABS
2.0000 | ORAL_TABLET | ORAL | Status: DC
  Administered 2022-01-06: 2 via ORAL
  Filled 2022-01-05: qty 2

## 2022-01-05 MED ORDER — DIPHENHYDRAMINE HCL 25 MG PO CAPS
25.0000 mg | ORAL_CAPSULE | ORAL | Status: DC | PRN
  Filled 2022-01-05: qty 1

## 2022-01-05 MED ORDER — FENTANYL CITRATE (PF) 100 MCG/2ML IJ SOLN
INTRAMUSCULAR | Status: AC
  Filled 2022-01-05: qty 2

## 2022-01-05 NOTE — Transfer of Care (Signed)
Immediate Anesthesia Transfer of Care Note  Patient: Elianna Windom  Procedure(s) Performed: CESAREAN SECTION  Patient Location: Mother/Baby  Anesthesia Type:General  Level of Consciousness: awake, alert , and oriented  Airway & Oxygen Therapy: Patient Spontanous Breathing and Patient connected to face mask oxygen  Post-op Assessment: Report given to RN, Post -op Vital signs reviewed and stable, and Patient moving all extremities  Post vital signs: Reviewed and stable  Last Vitals:  Vitals Value Taken Time  BP 113/76 01/05/22 0040  Temp    Pulse 114 01/05/22 0040  Resp 12 01/05/22 0040  SpO2 98 % 01/05/22 0040    Last Pain:  Vitals:   01/05/22 0040  TempSrc:   PainSc: 6       Patients Stated Pain Goal: 7 (01/04/22 0719)  Complications: No notable events documented.

## 2022-01-05 NOTE — Op Note (Addendum)
Cesarean Section Procedure Note  Date of procedure: 01/05/2022   Pre-operative Diagnosis: Intrauterine pregnancy at [redacted]w[redacted]d;  - failure to progress - cat II strip remote from delivery  Post-operative Diagnosis: same, delivered.  Procedure: Primary Low Transverse Cesarean Section through Pfannenstiel incision  Surgeon: Christeen Douglas, MD  Assistant(s):  Doree Barthel, CNM  Anesthesia: General endotracheal anesthesia  Anesthesiologist: Stephanie Coup, MD Anesthesiologist: Stephanie Coup, MD CRNA: Johney Maine D, CRNA  Estimated Blood Loss:           Drains: foley cath         Total IV Fluids:  Urine Output: 28ml         Specimens: cord blood and arterial cord gas collected         Complications:  None; patient tolerated the procedure well.         Disposition: PACU - hemodynamically stable.         Condition: stable  Findings:  A female infant in cephalic presentation. Amniotic fluid - Meconium  Birth weight pending.  Apgars of 3 and 7 at one and five minutes respectively.  Intact placenta with a three-vessel cord.  Grossly normal uterus, tubes and ovaries bilaterally. No intraabdominal adhesions were noted.  Spinal anesthesia was attempted, and after a trial without adequate anesthesia patient declined further attempts. Plan to proceed with general anesthesia.  Indications:  Failure to progress, and fetal heart tones Cat II remote from delivery. Likely chorioamnionitis, but patient was not febrile prior to the OR. However, her intraabdominal temp was subjectively elevated, and anesthesia was monitoring for signs of sepsis during surgery.  36yo G2P1001 at [redacted]w[redacted]d with iol for PreE with elevated BP on arrival and proteinuria. No severe features. She has not progressed - she has been 7-8 cm since 1046 this morning on pitocin, and fetal vertex has not descended into the pelvis. Particularly, the fht are Cat II without improvement. She has long periods of  minimal variability, and rare accels with occasional late decels. I am concerned for fetal well being.   After discussion with patient and partner with interpreter, decision to deliver by pLTCS was made.   Procedure Details  The patient was taken to the operating room and provided with adequate anesthesia as noted above. She was given 2g of Ancef and of azithro. Midway through the proceed her temp rose, and we added 900mg  clinda.  She was prepped and draped in the normal sterile fashion in the dorsal supine position with a leftward tilt. A time-out was taken to ensure the correct patient would have the correct procedure, that her allergies were identified, and that antibiotics had been given as listed above. A Pfannensteil skin incision was made with the scalpel and carried through to the underlying layer of fascia.  The fascia was incised in the midline and the incision extended laterally with lateral traction. The fascia was retracted in cephalad/caudad fashion bluntly.  The underlying rectus muscles were dissected off with blunt retraction.  The rectus muscles were then separated in the midline, and the peritoneum identified, and entered bluntly. The peritoneal incision was extended superiorly and inferiorly with good visualization of the bladder.  The bladder blade was then inserted and the vesicouterine peritoneum identified.  A low transverse uterine incision was made with the scalpel above the bladder reflection and extended in a cephalad/caudal fashion bluntly.  The bladder blade was removed and the infant's head delivered atraumatically followed by the shoulders and body.  The baby was stimulated  while the cord clamped and cut, and the infant handed off to waiting pediatricians. Cord gases were collected after the case and sent.   The placenta was then removed with gentle traction on the cord and uterine massage.  The uterus was then exteriorized and cleared of all clots and debris.  The  uterine incision was repaired with 0 Vicryl in a running locked fashion.  A second layer of the same suture was used in an imbricating fashion to obtain hemostasis. Several figure of 8 sutures and Bovie electrosurgery were used to achieve excellent hemostasis. The posterior cul-de-sac was cleared of all clots. This is the point where her temp was noted to be subjectively elevated.  The uterus was returned to the abdominal cavity.  The uterine incision was reinspected and found to be hemostatic. The fascia was elevated and the rectus muscles and fascial surface were found to be hemostatic.  The fascia was closed with 0-Monocryl in a running fashion.  The subcutaneous tissues were then irrigated and Bovie electrosurgery was used to complete hemostasis.   The subcutaneous tissue was closed with 2-0 vicryl in running fashion. The skin was closed with Ensorb.  77ml (in 30 of 0.5% bupivicaine and 76ml of NSS) bupivicaine placed in the fascial and skin lines.  Sponge, lap, needle, and instrument counts were correct times two.   The patient tolerated the procedure well and was taken to the recovery room in stable condition.  Anesthesia is considering pain during the procedure as possible cause of her tachypnea during surgery. I will place her on a PCA and continue chorio abx until 24hrs afebrile, watching carefully. I will repeat her CBC and BMP in 4 hours.  Christeen Douglas, MD 01/05/2022

## 2022-01-05 NOTE — Progress Notes (Addendum)
Called to patient bedside for ongoing bleeding 2 hours after delivery. Fundus not firm, large clots being evacuated. Nursing staff and CNM at bedside. 800mg  misoprostol given rectally. Jada opened and 2u pRBCs being started. Difficulty with monitor showing low O2 sats but pt breathing and speaking well. Hypotensive. Pt denies dizziness but does feel fatigue.  Jada placed after evacuation of approx in clots from LUS and cervix. 1mg  iv diluadid given prior to placement. of sterile saline placed in balloon and wall suction at . Another IV started and blood collected. IV fluid bolus given.   Lungs: CTAB to bases Fundus: firm to palpation but atonic without. Temp: 98 oral Today's Vitals   01/05/22 0130 01/05/22 0142 01/05/22 0145 01/05/22 0151  BP: 90/60 (!) 91/56 93/60   Pulse: (!) 101 90 93 93  Resp: (!) 27 (!) 22 (!) 28 11  Temp:  99.2 F (37.3 C)  98.3 F (36.8 C)  TempSrc:  Axillary  Oral  SpO2: 93%  (P) 97% 100%  Weight:      Height:      PainSc:       Body mass index is 35.12 kg/m.  Plan Postpartum hemorrhage:  - Will check CBC and BMP now and in 8 hrs. COntinue 2u pRBCs to keep up with losses, and consider 1:1 if needed for continued bleeding - Monitor Jada output, plan to leave in place for 1 hr after bleeding slows/stops but no longer than 24hrs. ID:  - Continue triple iv abx for suspected IAI. Tylenol for temp. IV fluid bolus as needed. Continue foley for monitoring urine output.  Interpreter and spouse present at the bedside with infant. All questions answered

## 2022-01-05 NOTE — Discharge Summary (Signed)
OB Discharge Summary  Interpreter present for discharge visit/instructions     Patient Name: Lori Smith DOB: Apr 30, 1985 MRN: [id number]  Date of admission: 01/03/2022 Delivering MD: Christeen Douglas Date of Delivery: 01/04/2022  Date of discharge: 01/09/2022  Admitting diagnosis: Labor and delivery, indication for care [O75.9] Post-dates pregnancy [O48.0] Intrauterine pregnancy: [redacted]w[redacted]d     Secondary diagnosis: Preeclampsia     Discharge diagnosis: Preeclampsia (mild) and postpartum hemorrhage, chorioamnionitis                                                                                                 Post partum procedures: blood transfusion and JADA  Augmentation: Pitocin  Complications: Hemorrhage>1022mL  Hospital course:  Induction of Labor With Cesarean Section   36 y.o. yo G2P2002 at [redacted]w[redacted]d was admitted to the hospital 01/03/2022 for induction of labor. Patient had a labor course significant for failure to progress, development of chorioamnionitis. The patient went for cesarean section due to Arrest of Dilation. Delivery details are as follows: Membrane Rupture Time/Date: 6:51 AM ,01/04/2022  thick meconium Delivery Method:C-Section, Low Transverse  Details of operation can be found in separate operative Note.    Patient had a postpartum course complicated by postpartum hemorrhage with JADA placement and 2 Units PRBC transfused, treatment for chorioamnionitis. She developed Ileus postpartum and was NPO with advancement to regular diet. At time of discharge she is ambulating, tolerating a regular diet, passing flatus, +BM, and urinating well.  Her pain is controlled with PO medication.  Patient is discharged home in stable condition on 01/09/22.      Newborn Data: Birth date:01/04/2022  Birth time:11:36 PM  Gender:Female    Jazlyn Living status:Living  Apgars:3 ,7  Weight:3320 g                                Physical exam  Vitals:   01/08/22 2340 01/09/22  0500 01/09/22 0825 01/09/22 1115  BP: 121/71 135/83 (!) 143/87 139/87  Pulse: 87 73 72 87  Resp: 19 18 18 18   Temp: 98.4 F (36.9 C) 97.9 F (36.6 C) 98.7 F (37.1 C) 98.6 F (37 C)  TempSrc: Oral Oral Oral Oral  SpO2: 97% 98% 100%   Weight:      Height:       General: alert, cooperative, and no distress Lochia: appropriate Uterine Fundus: firm Incision: Dressing is clean, dry, and intact DVT Evaluation: No evidence of DVT seen on physical exam.  Labs: Lab Results  Component Value Date   WBC 21.0 (H) 01/06/2022   HGB 9.9 (L) 01/06/2022   HCT 28.8 (L) 01/06/2022   MCV 87.8 01/06/2022   PLT 206 01/06/2022    Discharge instruction: per After Visit Summary.  Medications:  Allergies as of 01/09/2022   No Known Allergies      Medication List     TAKE these medications    acetaminophen 500 MG tablet Commonly known as: TYLENOL Take 2 tablets (1,000 mg total) by mouth every 6 (six) hours as needed for mild pain, moderate pain,  fever or headache.   docusate sodium 100 MG capsule Commonly known as: Colace Take 1 capsule (100 mg total) by mouth daily as needed.   ibuprofen 600 MG tablet Commonly known as: ADVIL Take 1 tablet (600 mg total) by mouth every 6 (six) hours as needed for moderate pain, mild pain or cramping.   oxyCODONE 5 MG immediate release tablet Commonly known as: Oxy IR/ROXICODONE Take 1 tablet (5 mg total) by mouth every 6 (six) hours as needed for up to 5 days for severe pain.   Prenatal Vitamins 28-0.8 MG Tabs Take 1 tablet by mouth daily.   simethicone 80 MG chewable tablet Commonly known as: MYLICON Chew 1 tablet (80 mg total) by mouth 3 (three) times daily after meals.               Discharge Care Instructions  (From admission, onward)           Start     Ordered   01/09/22 0000  Discharge wound care:       Comments: Keep incision dry, clean.   01/09/22 1139            Diet: routine diet  Activity: Advance as  tolerated. Pelvic rest for 6 weeks.   Outpatient follow up:  Follow-up Information     [facility] Follow [facility].   Specialty: Obstetrics and Gynecology Why: Follow up with Dr. Marcelline Mates for 10-14 days post-op check Contact information: [address] [place] [id number] [phone]        [facility] DEPT. Schedule an appointment as soon as possible for a visit in 6 week(s).   Why: Postpartum visit and Depo injection Contact information: [address] [place] SSN-[id number] [phone]                  Postpartum contraception: Depo Provera Rhogam Given postpartum: Rh positive Rubella vaccine given postpartum: no Varicella vaccine given postpartum: no TDaP given antepartum or postpartum: given antepartum    Newborn Delivery   Birth date/time: 01/04/2022 23:36:00 Delivery type: C-Section, Low Transverse Trial of labor: Yes C-section categorization: Primary       Baby Feeding:  Breast/Formula  Disposition: Newborn discharged home with dad yesterday  SIGNED:  Rod Can, CNM 01/09/2022 11:44 AM

## 2022-01-05 NOTE — Anesthesia Procedure Notes (Addendum)
Procedure Name: Intubation Date/Time: 01/04/2022 11:30 AM  Performed by: Katherine Basset, CRNAPre-anesthesia Checklist: Patient identified, Emergency Drugs available, Suction available and Patient being monitored Patient Re-evaluated:Patient Re-evaluated prior to induction Oxygen Delivery Method: Circle system utilized Preoxygenation: Pre-oxygenation with 100% oxygen Induction Type: IV induction and Rapid sequence Ventilation: Mask ventilation without difficulty Laryngoscope Size: McGraph and 3 Grade View: Grade I Tube type: Oral Tube size: 7.0 mm Number of attempts: 1 Airway Equipment and Method: Stylet, Oral airway and Bite block Placement Confirmation: ETT inserted through vocal cords under direct vision, positive ETCO2 and breath sounds checked- equal and bilateral Secured at: 22 cm Tube secured with: Tape Dental Injury: Teeth and Oropharynx as per pre-operative assessment

## 2022-01-05 NOTE — Progress Notes (Signed)
Postpartum Day # 1: Cesarean Delivery (performed under General Anesthesia, postpartum hemorrhage, s/p blood transfusion 2 units PRBCs, chorioamnionitis, mild pre-eclamspsia)  Subjective: Language Line Spanish Interpreter Byrd Hesselbach [phone] Patient reports incisional pain 8/10, however denies any major issues.  Notes that she recently received pain medication.  Overall feels fine. Is attempting to breastfeed. Has not yet ambulated. Bleeding small amounts (with Jada system in place).   Objective: Vital signs in last 24 hours: Temp:  [98.2 F (36.8 C)-99.5 F (37.5 C)] 98.3 F (36.8 C) (12/30 1134) Pulse Rate:  [54-120] 66 (12/30 1134) Resp:  [0-30] 16 (12/30 1134) BP: (59-153)/(44-102) 131/75 (12/30 1134) SpO2:  [84 %-100 %] 93 % (12/30 1134)  I/O last 3 completed shifts: In: 3639.4 [I.V.:1418.5; Blood:1459.8; IV Piggyback:761.1] Out: 2417 [Urine:278; Blood:2139] Total I/O In: 208.1 [I.V.:208.1] Out: 377 [Urine:107; Blood:270]   Physical Exam:  General: alert and no distress Lungs: clear to auscultation bilaterally Breasts: normal appearance, no masses or tenderness Heart: regular rate and rhythm, S1, S2 normal, no murmur, click, rub or gallop Abdomen:  soft, moderately tender to palpation at incision site, bowel sounds active.  Pelvis: Lochia appropriate, Uterine Fundus firm, Incision: bandage clean, dry, intact Extremities: DVT Evaluation: Negative Homan's sign. No cords or calf tenderness. No significant calf/ankle edema. SCDs in place.    Labs:     Latest Ref Rng & Units 01/05/2022   10:15 AM 01/05/2022    1:03 AM 01/03/2022    6:25 PM  CBC  WBC 4.0 - 10.5 K/uL 24.6  15.5  9.9   Hemoglobin 12.0 - 15.0 g/dL 02.7  9.3  25.3   Hematocrit 36.0 - 46.0 % 32.6  28.4  37.4   Platelets 150 - 400 K/uL 167  206  262        Latest Ref Rng & Units 01/05/2022   10:15 AM 01/05/2022    1:03 AM 01/03/2022    6:25 PM  CMP  Glucose 70 - 99 mg/dL 664  403  89   BUN 6 - 20 mg/dL 14  10   9    Creatinine 0.44 - 1.00 mg/dL  4.74  2.59   Sodium 135 - 145 mmol/L 133  133  136   Potassium 3.5 - 5.1 mmol/L 4.3  3.9  3.8   Chloride 98 - 111 mmol/L 107  112  110   CO2 22 - 32 mmol/L 17  16  17    Calcium 8.9 - 10.3 mg/dL 7.2  6.8  8.8   Total Protein 6.5 - 8.1 g/dL  4.7  6.7   Total Bilirubin 0.3 - 1.2 mg/dL  0.8  0.6   Alkaline Phos 38 - 126 U/L  164  231   AST 15 - 41 U/L  26  26   ALT 0 - 44 U/L  11  19       Assessment/Plan: Status post primary Cesarean section with delayed PPH, s/p blood transfusion. Chorioamnionitis. Mild Pre-eclampsia.    - Breastfeeding and Lactation consult - Regular diet as tolerated - Discontinued PCA pump, now on PO pain management - Maintain foley catheter until urine output improves. Currently averaging 20 cc per hr. Patient received several IVF boluses, will administer dose of Lasix as Creatinine still wnl.  - Patient may ambulate once more stable.  - Hemoglobin stable s/p 2 units PRBCs.  - BPs remain wnl postpartum. No magnesium sulfate indicated, has not had any severe range BPs.  - Continue antibiotics for 24 hrs postpartum for chorioamnionitis.  -  Will deflate Jada as patient has had no heavy bleeding for ~ 2 hours. Can remove after 30 min if remains stable. Patient also received Cytotec postpartum.  - Continue current care.   Hildred Laser, MD [facility]

## 2022-01-05 NOTE — Progress Notes (Signed)
Electronic interpreter used upon transfer to postpartum floor.  Lori Smith #[id number]

## 2022-01-06 ENCOUNTER — Encounter: Payer: Self-pay | Admitting: Obstetrics and Gynecology

## 2022-01-06 LAB — BPAM RBC
Blood Product Expiration Date: 202401032359
Blood Product Expiration Date: 202401082359
ISSUE DATE / TIME: 202312300130
ISSUE DATE / TIME: 202312300332
Unit Type and Rh: 5100
Unit Type and Rh: 5100

## 2022-01-06 LAB — TYPE AND SCREEN
ABO/RH(D): O POS
Antibody Screen: NEGATIVE
Unit division: 0
Unit division: 0

## 2022-01-06 LAB — RESP PANEL BY RT-PCR (RSV, FLU A&B, COVID)  RVPGX2
Influenza A by PCR: NEGATIVE
Influenza B by PCR: NEGATIVE
Resp Syncytial Virus by PCR: NEGATIVE
SARS Coronavirus 2 by RT PCR: NEGATIVE

## 2022-01-06 LAB — CBC WITH DIFFERENTIAL/PLATELET
Abs Immature Granulocytes: 0.23 10*3/uL — ABNORMAL HIGH (ref 0.00–0.07)
Basophils Absolute: 0.1 10*3/uL (ref 0.0–0.1)
Basophils Relative: 0 %
Eosinophils Absolute: 0 10*3/uL (ref 0.0–0.5)
Eosinophils Relative: 0 %
HCT: 28.8 % — ABNORMAL LOW (ref 36.0–46.0)
Hemoglobin: 9.9 g/dL — ABNORMAL LOW (ref 12.0–15.0)
Immature Granulocytes: 1 %
Lymphocytes Relative: 14 %
Lymphs Abs: 2.9 10*3/uL (ref 0.7–4.0)
MCH: 30.2 pg (ref 26.0–34.0)
MCHC: 34.4 g/dL (ref 30.0–36.0)
MCV: 87.8 fL (ref 80.0–100.0)
Monocytes Absolute: 0.8 10*3/uL (ref 0.1–1.0)
Monocytes Relative: 4 %
Neutro Abs: 17 10*3/uL — ABNORMAL HIGH (ref 1.7–7.7)
Neutrophils Relative %: 81 %
Platelets: 206 10*3/uL (ref 150–400)
RBC: 3.28 MIL/uL — ABNORMAL LOW (ref 3.87–5.11)
RDW: 13.7 % (ref 11.5–15.5)
WBC: 21 10*3/uL — ABNORMAL HIGH (ref 4.0–10.5)
nRBC: 0 % (ref 0.0–0.2)

## 2022-01-06 LAB — URINALYSIS, COMPLETE (UACMP) WITH MICROSCOPIC
Bilirubin Urine: NEGATIVE
Glucose, UA: NEGATIVE mg/dL
Ketones, ur: NEGATIVE mg/dL
Nitrite: NEGATIVE
Protein, ur: NEGATIVE mg/dL
RBC / HPF: >50 RBC/hpf — ABNORMAL HIGH (ref 0–5)
Specific Gravity, Urine: 1.011 (ref 1.005–1.030)
pH: 5 (ref 5.0–8.0)

## 2022-01-06 MED ORDER — SIMETHICONE 80 MG PO CHEW: 80.0000 mg | CHEWABLE_TABLET | Freq: Three times a day (TID) | ORAL | 0 refills | Status: AC

## 2022-01-06 MED ORDER — IBUPROFEN 600 MG PO TABS
600.0000 mg | ORAL_TABLET | Freq: Four times a day (QID) | ORAL | Status: DC | PRN
  Administered 2022-01-06 – 2022-01-08 (×4): 600 mg via ORAL
  Filled 2022-01-06 (×5): qty 1

## 2022-01-06 MED ORDER — ACETAMINOPHEN 325 MG PO TABS
650.0000 mg | ORAL_TABLET | Freq: Four times a day (QID) | ORAL | Status: DC | PRN
  Administered 2022-01-06 – 2022-01-07 (×2): 650 mg via ORAL
  Filled 2022-01-06 (×3): qty 2

## 2022-01-06 MED ORDER — MAGNESIUM HYDROXIDE 400 MG/5ML PO SUSP
30.0000 mL | Freq: Every day | ORAL | Status: DC | PRN
  Administered 2022-01-06: 30 mL via ORAL
  Filled 2022-01-06: qty 30

## 2022-01-06 MED ORDER — DOCUSATE SODIUM 100 MG PO CAPS: 100.0000 mg | ORAL_CAPSULE | Freq: Every day | ORAL | 2 refills | Status: AC | PRN

## 2022-01-06 NOTE — Progress Notes (Signed)
PROGRESS NOTE  Notified by RN that Lori Smith was having increasing abdominal pain and had passed a softball-sized clot. Bleeding improved after clot was expressed. VSS. Hgb 9.9. Lori Smith still has not passed gas. She has been drinking coffee and apple/prune juice and has ambulated a little bit.  On exam, her abdomen is more distended than this morning and is tender to palpation. Bowel sounds normal x 4. Fundus is firm with minimal bleeding. Lori Smith feels stable when she is up and walking.  Discussed plan with Dr. Valentino Saxon. Will give MOM now and continue frequent ambulation.  Glenetta Borg, CNM 2:48 PM 01/06/22

## 2022-01-06 NOTE — Progress Notes (Signed)
Subjective: Postpartum Day 2: Cesarean Delivery Lori Smith is feeling well overall. She has been ambulating in the room but not in the hallway yet. She reports feeling a little lightheaded when she gets up but feels okay to walk. She is voiding and tolerating POs without difficulty. She is not passing gas yet and has not had a bowel movement. Her pain is well-controlled and her bleeding is WNL. Her mood is stable. She is breast and bottle feeding, and both are going well.   Objective: Vital signs in last 24 hours: Temp:  [97.7 F (36.5 C)-98.2 F (36.8 C)] 98.2 F (36.8 C) (12/31 0853) Pulse Rate:  [69-90] 88 (12/31 0853) Resp:  [16-20] 17 (12/31 0853) BP: (118-129)/(67-84) 120/72 (12/31 0853) SpO2:  [94 %-96 %] 94 % (12/31 0853)  Physical Exam:  General: alert and cooperative Heart: RRR Lungs: CTAB Abdomen: distended and mildly tender to palpation, normal bowel sounds Lochia: appropriate Uterine Fundus: firm Incision: healing well, honeycomb dressing C/D/I Labia still edematous DVT Evaluation: No evidence of DVT seen on physical exam, 1+ pitting edema of both feet  Recent Labs    01/05/22 0103 01/05/22 1015  HGB 9.3* 10.7*  HCT 28.4* 32.6*    Assessment/Plan: Status post Cesarean section. Doing well postoperatively.  Continue current care Encourage ambulation, hydration, simethicone, hot beverages. Lactation assistance as needed  Glenetta Borg, CNM 01/06/2022, 11:43 AM

## 2022-01-06 NOTE — Progress Notes (Signed)
PROGRESS NOTE  Notified by RN that Lori Smith now has a fever of 101.9. She endorses chills and continued abdominal pain but denies headache, body aches, and sore throat. She reports she feels like she has to cough. She denies any recent known exposures to illness. On exam,  heart rate is regular with mild murmur. Breath sounds clear but slightly diminished in lower right lobe. +1 pitting edema in BLE, negative Homan's, no s/s of DVT. Discussed findings with Dr. Valentino Saxon. UA, respiratory panel ordered. Incentive spirometry q1hr and frequent ambulation. Will order CXR if fever returns.  Glenetta Borg, CNM 01/06/22 4:08 PM

## 2022-01-06 NOTE — Anesthesia Postprocedure Evaluation (Signed)
Anesthesia Post Note  Patient: Lori Smith  Procedure(s) Performed: CESAREAN SECTION  Patient location during evaluation: Mother Baby Anesthesia Type: General Level of consciousness: awake and alert Pain management: pain level controlled Vital Signs Assessment: post-procedure vital signs reviewed and stable Respiratory status: spontaneous breathing, nonlabored ventilation, respiratory function stable and patient connected to nasal cannula oxygen Cardiovascular status: blood pressure returned to baseline and stable Postop Assessment: no apparent nausea or vomiting Anesthetic complications: no  No notable events documented.   Last Vitals:  Vitals:   01/06/22 0337 01/06/22 0853  BP: 124/77 120/72  Pulse: 90 88  Resp: 20 17  Temp: 36.5 C 36.8 C  SpO2: 95% 94%    Last Pain:  Vitals:   01/06/22 0936  TempSrc:   PainSc: 6                  Stephanie Coup

## 2022-01-07 ENCOUNTER — Inpatient Hospital Stay: Payer: Medicaid Other

## 2022-01-07 DIAGNOSIS — K9189 Other postprocedural complications and disorders of digestive system: Secondary | ICD-10-CM | POA: Insufficient documentation

## 2022-01-07 MED ORDER — ACETAMINOPHEN 500 MG PO TABS
1000.0000 mg | ORAL_TABLET | Freq: Four times a day (QID) | ORAL | Status: DC | PRN
  Administered 2022-01-07: 1000 mg via ORAL
  Filled 2022-01-07 (×2): qty 2

## 2022-01-07 MED ORDER — OXYCODONE HCL 5 MG PO TABS: 5.0000 mg | ORAL_TABLET | Freq: Four times a day (QID) | ORAL | Status: DC | PRN

## 2022-01-07 MED ORDER — LACTATED RINGERS IV SOLN: INTRAVENOUS | Status: AC

## 2022-01-07 MED ORDER — TRAMADOL HCL 50 MG PO TABS: 100.0000 mg | ORAL_TABLET | Freq: Four times a day (QID) | ORAL | Status: DC | PRN

## 2022-01-07 NOTE — Progress Notes (Signed)
Pt ambulated around nursing station three times. Entered bathroom when back to room and passed gas and had a smear of BM. Pt stated she felt better. Asked pt how she would feel if she was to lay down again and she stated it would still hurt. Gave pt tylenol and ibuprofen for pain.

## 2022-01-07 NOTE — Progress Notes (Signed)
Pt c/o abdominal pain when entered room to do a fundal check. Abdomin slightly distended and soft to touch slightly firmer in ruq and pt moans and grimaces when palpated on right side. States it hurts worse on that side but hurts all over. States pain 10/10 when laying down and 3/10 when sitting on side of bed. Instructed pt to walk around nursing station if possible. Pt agreeable. CNM notified and stated will come to assess pt

## 2022-01-07 NOTE — Lactation Note (Signed)
This note was copied from a baby's chart. Lactation Consultation Note  Patient Name: Girl Marynell Bies YJEHU'D Date: 01/07/2022 Reason for consult: Follow-up assessment;Term Age:37 hours Spanish interpreter per language line  ipad utilized Maternal Data Does the patient have breastfeeding experience prior to this delivery?: Yes How long did the patient breastfeed?: 8 mths  Feeding Mother's Current Feeding Choice: Breast Milk and Formula Nipple Type: Slow - flow Family member bottlefeeding baby formula when room entered, had already fed 1.5 oz of formula.  Through interpreter, mom encouraged to breastfeed baby every 2-3 hrs first then if needed supplement with formula in small amts, 15cc, mom states her breasts feel fuller and that when she nurses she does hear swallows, she states the baby latches easily to the breast.  Mom informed that more frequent breastfeeding would encourage more milk production.    LATCH Score Latch:  (I did not observe a breastfeeding)                  Lactation Tools Discussed/Used    Interventions Interventions: Breast feeding basics reviewed;Education Mom encouraged through [place] interpreter to start ambulating in room and in hall to move gas and encourage bowel sounds to increase as she is sitting up in chair and abd appears enlarged with gas.  She verbalized understanding and states she will start ambulating more.   Discharge Pump: Personal [place] Program: Yes  Consult Status Consult Status: PRN    Ferol Luz 01/07/2022, 3:18 PM

## 2022-01-07 NOTE — Progress Notes (Signed)
10:26 am Electronic interpreter [phone] Lori Smith

## 2022-01-07 NOTE — Progress Notes (Signed)
Melissa CNM in to see pt.

## 2022-01-07 NOTE — Discharge Instructions (Signed)
Discharge Instructions:   Follow-up Appointment: Call and schedule a follow-up appointment for a visit in 2-weeks with Dr. Leafy Ro at [facility]!   If there are any new medications, they have been ordered and will be available for pickup at the listed pharmacy on your way home from the hospital.   Call office if you have any of the following: headache, visual changes, fever >101.0 F, chills, shortness of breath, breast concerns, excessive vaginal bleeding, incision drainage or problems, leg pain or redness, depression or any other concerns. If you have vaginal discharge with an odor, let your doctor know.   It is normal to bleed for up to 6 weeks. You should not soak through more than 1 pad in 1 hour. If you have a blood clot larger than your fist with continued bleeding, call your doctor.   After a c-section, you should expect a small amount of blood or clear fluid coming from the incision and abdominal cramping/soreness. Inspect your incision site daily. Stand in front of a mirror to look for any redness, incision opening, or discolored/odorness drainage. Take a shower daily and continue good hygiene. Use own towel and washcloth (do not share). Make sure your sheets on your bed are clean. No pets sleeping around your incision site. Dressing will be removed at your postpartum visit. If the dressing does become wet or soiled underneath, it is okay to remove it.   On-Q pump: You will remove on day 5 after insertion or if the ball becomes flat before day 5. You will remove on: May 10, 2018  Activity: Do not lift > 10 lbs for 6 weeks (do not lift anything heavier than your baby). No intercourse, tampons, swimming pools, hot tubs, baths (only showers) for 6 weeks.  No driving for 1-2 weeks. Continue prenatal vitamin, especially if breastfeeding. Increase calories and fluids (water) while breastfeeding.   Your milk will come in, in the next couple of days (right now it is colostrum). You may have  a slight fever when your milk comes in, but it should go away on its own.  If it does not, and rises above 101 F please call the doctor. You will also feel achy and your breasts will be firm. They will also start to leak. If you are breastfeeding, continue as you have been and you can pump/express milk for comfort.   If you have too much milk, your breasts can become engorged, which could lead to mastitis. This is an infection of the milk ducts. It can be very painful and you will need to notify your doctor to obtain a prescription for antibiotics. You can also treat it with a shower or hot/cold compress.   For concerns about your baby, please call your pediatrician.  For breastfeeding concerns, the lactation consultant can be reached at [phone].   Postpartum blues (feelings of happy one minute and sad another minute) are normal for the first few weeks but if it gets worse let your doctor know.   Congratulations! We enjoyed caring for you and your new bundle of joy!

## 2022-01-07 NOTE — Progress Notes (Signed)
PROGRESS NOTE  Subjective Notified by RN that Alanee is having diffuse abdominal pain at 10/10 when lying down. She has passed a little bit of gas and a small amount of liquid stool with some relief but is still feeling very distended. She has remained afebrile since this evening.  Objective BP 132/77 (BP Location: Left Arm)   Pulse 94   Temp 97.9 F (36.6 C) (Oral)   Resp 18   Ht 5' 1.02" (1.55 m)   Wt 84.4 kg   LMP 03/24/2021 (Exact Date)   SpO2 98%   Breastfeeding Unknown   BMI 35.12 kg/m   Exam: Abdomen is moderately distended and tender to palpation on exam; bowel sounds present but diminished.  Assessment Possible ileus  Plan Plain films of abdomen NPO until AM Continue to assess  Lurlean Horns, CNM 01/07/22 2:27 AM

## 2022-01-07 NOTE — Progress Notes (Signed)
Subjective:   Madi Bonfiglio had a primary c/s on 12/30 after not progressing with IOL for later term/preeclampsia and Cat II fetal tracing while remote from delivery. She had MSAF, developed chorio, had a PPH (EBL 1700), Jada system placed and later removed, received a blood transfusion of 2u PRBCs. Had one temperature yesterday afternoon of 101.9, has not returned. Last night she started having more severe abdominal pain and was rating her pain a 10/10. An x-ray was done which confirmed dx of an ileus. Has been NPO since. States that since the xray she has been able to have three small loose stools and that her abd pain is now a 4/10. Does endorse feeling distended, abd pain is not incisional but higher up. Has not had anything to eat or drink, denies vomiting.  Pt. Is voiding regularly without difficulty. She is breastfeeding and bottle feeding. Has no pain with nursing, states the nurses just gave her daughter a bottle earlier, no sure why.  Reports small amount of vaginal bleeding, denies passing large blood clots. Has had cramping abdomen pain relieved with tylenol, ibuprofen, and oxycodone. She misses her 64 y/o daughter who she has never been away from, is sad and crying at the thought of being away from her for another night. Endorses good support from partner and family.   Objective:  Vital signs in last 24 hours: Temp:  [97.6 F (36.4 C)-101.9 F (38.8 C)] 98.2 F (36.8 C) (01/01 0758) Pulse Rate:  [74-106] 74 (01/01 0758) Resp:  [18-20] 20 (01/01 0758) BP: (125-136)/(77-90) 129/87 (01/01 0758) SpO2:  [97 %-100 %] 100 % (01/01 0758)    General: NAD Pulmonary: no increased work of breathing, LCTAB Cardiac: S1S1, RRR Breasts: soft, non-tender, nipples without breakdown Abdomen: slightly distended and slight tenderness diffusely, BS hypoactive in all four quadrants although were auscultated Fundus: firm, midline, U-1 Lochia: light rubra, no clots Perineum: no erythema or foul  odor discharge, minimal edema Incision: dressing on, intact, appears dry and not soiled Extremities: no edema, no erythema, no tenderness  Results for orders placed or performed during the hospital encounter of 01/03/22 (from the past 72 hour(s))  CBC     Status: Abnormal   Collection Time: 01/05/22  1:03 AM  Result Value Ref Range   WBC 15.5 (H) 4.0 - 10.5 K/uL   RBC 3.13 (L) 3.87 - 5.11 MIL/uL   Hemoglobin 9.3 (L) 12.0 - 15.0 g/dL    Comment: REPEATED TO VERIFY   HCT 28.4 (L) 36.0 - 46.0 %   MCV 90.7 80.0 - 100.0 fL   MCH 29.7 26.0 - 34.0 pg   MCHC 32.7 30.0 - 36.0 g/dL   RDW 98.3 38.2 - 50.5 %   Platelets 206 150 - 400 K/uL   nRBC 0.0 0.0 - 0.2 %    Comment: Performed at [facility], [address] [place], [place] [address]  Comprehensive metabolic panel     Status: Abnormal   Collection Time: 01/05/22  1:03 AM  Result Value Ref Range   Sodium 133 (L) 135 - 145 mmol/L   Potassium 3.9 3.5 - 5.1 mmol/L   Chloride 112 (H) 98 - 111 mmol/L   CO2 16 (L) 22 - 32 mmol/L   Glucose, Bld 118 (H) 70 - 99 mg/dL    Comment: Glucose reference range applies only to samples taken after fasting for at least 8 hours.   BUN 10 6 - 20 mg/dL   Creatinine, Ser 3.41 0.44 -  1.00 mg/dL   Calcium 6.8 (L) 8.9 - 10.3 mg/dL   Total Protein 4.7 (L) 6.5 - 8.1 g/dL   Albumin 1.8 (L) 3.5 - 5.0 g/dL   AST 26 15 - 41 U/L   ALT 11 0 - 44 U/L   Alkaline Phosphatase 164 (H) 38 - 126 U/L   Total Bilirubin 0.8 0.3 - 1.2 mg/dL   GFR, Estimated >40 >97 mL/min    Comment: (NOTE) Calculated using the CKD-EPI Creatinine Equation (2021)    Anion gap 5 5 - 15    Comment: Performed at [facility], [address] [place], [place] [address]  Prepare RBC (crossmatch)     Status: None   Collection Time: 01/05/22  1:18 AM  Result Value Ref Range   Order Confirmation      ORDER PROCESSED BY BLOOD BANK Performed at [facility], [address] [place], [place] [address]   CBC      Status: Abnormal   Collection Time: 01/05/22 10:15 AM  Result Value Ref Range   WBC 24.6 (H) 4.0 - 10.5 K/uL   RBC 3.57 (L) 3.87 - 5.11 MIL/uL   Hemoglobin 10.7 (L) 12.0 - 15.0 g/dL   HCT 83.4 (L) 19.6 - 22.2 %   MCV 91.3 80.0 - 100.0 fL   MCH 30.0 26.0 - 34.0 pg   MCHC 32.8 30.0 - 36.0 g/dL   RDW 97.9 89.2 - 11.9 %   Platelets 167 150 - 400 K/uL   nRBC 0.0 0.0 - 0.2 %    Comment: Performed at [facility], [address] [place], [place] [address]  Basic metabolic panel     Status: Abnormal   Collection Time: 01/05/22 10:15 AM  Result Value Ref Range   Sodium 133 (L) 135 - 145 mmol/L   Potassium 4.3 3.5 - 5.1 mmol/L   Chloride 107 98 - 111 mmol/L   CO2 17 (L) 22 - 32 mmol/L   Glucose, Bld 128 (H) 70 - 99 mg/dL    Comment: Glucose reference range applies only to samples taken after fasting for at least 8 hours.   BUN 14 6 - 20 mg/dL   Creatinine, Ser 8.14 0.44 - 1.00 mg/dL   Calcium 7.2 (L) 8.9 - 10.3 mg/dL   GFR, Estimated >48 >18 mL/min    Comment: (NOTE) Calculated using the CKD-EPI Creatinine Equation (2021)    Anion gap 9 5 - 15    Comment: Performed at [facility], [address] [place], [place] [address]  CBC with Differential/Platelet     Status: Abnormal   Collection Time: 01/06/22 12:02 PM  Result Value Ref Range   WBC 21.0 (H) 4.0 - 10.5 K/uL   RBC 3.28 (L) 3.87 - 5.11 MIL/uL   Hemoglobin 9.9 (L) 12.0 - 15.0 g/dL   HCT 97.0 (L) 26.3 - 78.5 %   MCV 87.8 80.0 - 100.0 fL   MCH 30.2 26.0 - 34.0 pg   MCHC 34.4 30.0 - 36.0 g/dL   RDW 88.5 02.7 - 74.1 %   Platelets 206 150 - 400 K/uL   nRBC 0.0 0.0 - 0.2 %   Neutrophils Relative % 81 %   Neutro Abs 17.0 (H) 1.7 - 7.7 K/uL   Lymphocytes Relative 14 %   Lymphs Abs 2.9 0.7 - 4.0 K/uL   Monocytes Relative 4 %   Monocytes Absolute 0.8 0.1 - 1.0 K/uL   Eosinophils Relative 0 %   Eosinophils Absolute 0.0 0.0 -  0.5 K/uL   Basophils Relative 0 %   Basophils Absolute 0.1 0.0 - 0.1 K/uL    Immature Granulocytes 1 %   Abs Immature Granulocytes 0.23 (H) 0.00 - 0.07 K/uL    Comment: Performed at [facility], [address] [place], [place] [address]  Resp panel by RT-PCR (RSV, Flu A&B, Covid) Anterior Nasal Swab     Status: None   Collection Time: 01/06/22  3:25 PM   Specimen: Anterior Nasal Swab  Result Value Ref Range   SARS Coronavirus 2 by RT PCR NEGATIVE NEGATIVE    Comment: (NOTE) SARS-CoV-2 target nucleic acids are NOT DETECTED.  The SARS-CoV-2 RNA is generally detectable in upper respiratory specimens during the acute phase of infection. The lowest concentration of SARS-CoV-2 viral copies this assay can detect is 138 copies/mL. A negative result does not preclude SARS-Cov-2 infection and should not be used as the sole basis for treatment or other patient management decisions. A negative result may occur with  improper specimen collection/handling, submission of specimen other than nasopharyngeal swab, presence of viral mutation(s) within the areas targeted by this assay, and inadequate number of viral copies(<138 copies/mL). A negative result must be combined with clinical observations, patient history, and epidemiological information. The expected result is Negative.  Fact Sheet for Patients:  EntrepreneurPulse.com.au  Fact Sheet for Healthcare Providers:  IncredibleEmployment.be  This test is no t yet approved or cleared by the [place] FDA and  has been authorized for detection and/or diagnosis of SARS-CoV-2 by FDA under an Emergency Use Authorization (EUA). This EUA will remain  in effect (meaning this test can be used) for the duration of the COVID-19 declaration under Section 564(b)(1) of the Act, 21 U.S.C.section 360bbb-3(b)(1), unless the authorization is terminated  or revoked sooner.       Influenza A by PCR NEGATIVE NEGATIVE   Influenza B by PCR NEGATIVE NEGATIVE    Comment: (NOTE) The Xpert  Xpress SARS-CoV-2/FLU/RSV plus assay is intended as an aid in the diagnosis of influenza from Nasopharyngeal swab specimens and should not be used as a sole basis for treatment. Nasal washings and aspirates are unacceptable for Xpert Xpress SARS-CoV-2/FLU/RSV testing.  Fact Sheet for Patients: EntrepreneurPulse.com.au  Fact Sheet for Healthcare Providers: IncredibleEmployment.be  This test is not yet approved or cleared by the [place] FDA and has been authorized for detection and/or diagnosis of SARS-CoV-2 by FDA under an Emergency Use Authorization (EUA). This EUA will remain in effect (meaning this test can be used) for the duration of the COVID-19 declaration under Section 564(b)(1) of the Act, 21 U.S.C. section 360bbb-3(b)(1), unless the authorization is terminated or revoked.     Resp Syncytial Virus by PCR NEGATIVE NEGATIVE    Comment: (NOTE) Fact Sheet for Patients: EntrepreneurPulse.com.au  Fact Sheet for Healthcare Providers: IncredibleEmployment.be  This test is not yet approved or cleared by the [place] FDA and has been authorized for detection and/or diagnosis of SARS-CoV-2 by FDA under an Emergency Use Authorization (EUA). This EUA will remain in effect (meaning this test can be used) for the duration of the COVID-19 declaration under Section 564(b)(1) of the Act, 21 U.S.C. section 360bbb-3(b)(1), unless the authorization is terminated or revoked.  Performed at [facility], [address] [place], [place] [address]   Urinalysis, Complete w Microscopic Urine, Clean Catch     Status: Abnormal   Collection Time: 01/06/22  3:56 PM  Result Value Ref Range   Color, Urine YELLOW (A) YELLOW  APPearance HAZY (A) CLEAR   Specific Gravity, Urine 1.011 1.005 - 1.030   pH 5.0 5.0 - 8.0   Glucose, UA NEGATIVE NEGATIVE mg/dL   Hgb urine dipstick LARGE (A) NEGATIVE   Bilirubin  Urine NEGATIVE NEGATIVE   Ketones, ur NEGATIVE NEGATIVE mg/dL   Protein, ur NEGATIVE NEGATIVE mg/dL   Nitrite NEGATIVE NEGATIVE   Leukocytes,Ua TRACE (A) NEGATIVE   RBC / HPF >50 (H) 0 - 5 RBC/hpf   WBC, UA 21-50 0 - 5 WBC/hpf   Bacteria, UA RARE (A) NONE SEEN   Squamous Epithelial / LPF 0-5 0 - 5 /HPF   Mucus PRESENT     Comment: Performed at [facility], [address], [place],  [address]    Assessment:   37 y.o. [phone] 3 day(s)  s/p NSVB Breast and bottle feeding Anemia secondary to acute blood loss with PPH and c/s VSS overall- only one mildly elevated BP in past 24 hr Pain well controlled Ileus S/p PPH with 2u blood transfusion and use of Jada device S/p IAI with triple abx treatment Mild preeclampsia  Plan:    Keep NPO and restart IVF to prevent dehydration. Anticiapte 24-48hr NPO for ileus management. Consider NG tube if she begins vomiting. PO Fe for anemia Lactation support PRN Primarily PO tylenol, ibuprofen and tramadol for pain management, leave PO narcotics as last option Continued routine postpartum care  Counseled on normal uterine involution and vaginal bleeding postpartum Will continue to monitor vital signs, bleeding, GI symptoms May try oral challenge tomorrow depending on how patient is doing Consulted with Dr. Marcelline Mates who is in agreement with plan.    Maggie Font, [place], [facility] 01/07/2022, 11:08 AM

## 2022-01-08 LAB — URINE CULTURE: Culture: NO GROWTH

## 2022-01-08 NOTE — Progress Notes (Addendum)
Progress Note - Cesarean Delivery  Lori Smith is a 37 y.o. G2P2002 now PP day 3 s/p C-Section, Low Transverse . Term preeclampsia, status post blood transfusion, chorioamnionitis,ileus. She has been taking fluids through the night and doing well.   Subjective:  Patient reports no problems with eating, bowel movements-had several yesterday, voiding, or their wound. She is passing gas. PT is requesting to go home as soon as able as she has a child at home that she does not have child care as of today.     Objective:  Vital signs in last 24 hours: Temp:  [98.1 F (36.7 C)-98.6 F (37 C)] 98.4 F (36.9 C) (01/02 0401) Pulse Rate:  [60-89] 60 (01/02 0401) Resp:  [18-20] 18 (01/02 0401) BP: (102-139)/(71-86) 102/74 (01/02 0401) SpO2:  [95 %-99 %] 95 % (01/02 0401)  Physical Exam: Bowel sounds: present upper quadrants, diminished lower quadrant General: alert, cooperative, and appears stated age 53: appropriate Uterine Fundus: firm Incision: healing well, no significant drainage, no dehiscence Lungs: clear Heart: RRR     Data Review Recent Labs    01/05/22 1015 01/06/22 1202  HGB 10.7* 9.9*  HCT 32.6* 28.8*    Assessment:   37 y.o. G2P2002 3 day(s)  s/p NSVB Breast and bottle feeding Anemia secondary to acute blood loss with PPH and c/s VSS overall- only one mildly elevated BP in past 24 hr Pain well controlled Ileus S/p PPH with 2u blood transfusion and use of Jada device S/p IAI with triple abx treatment Mild preeclampsia   Status post Cesarean section. Doing well postoperatively.     Plan:       Continue current care. PT tearful, requesting discharge ASAP due to child care issues with her other child. She is asking about potentially having baby go home with her partner tonight if she is not discharged. Encouraged that the baby stay with her . Discussed that her partner go home to care for their child. Explained that nursing staff would help her care  for baby if her partner has to leave. Consulted with Dr. Amalia Hailey on plan of care. Progress diet to soft for breakfast and lunch. If tolerates well, will have regular diet at dinner.    Philip Aspen, CNM  01/08/2022 8:33 AM

## 2022-01-09 DIAGNOSIS — O411290 Chorioamnionitis, unspecified trimester, not applicable or unspecified: Secondary | ICD-10-CM | POA: Insufficient documentation

## 2022-01-09 MED ORDER — OXYCODONE HCL 5 MG PO TABS: 5.0000 mg | ORAL_TABLET | Freq: Four times a day (QID) | ORAL | 0 refills | Status: AC | PRN

## 2022-01-09 MED ORDER — ACETAMINOPHEN 500 MG PO TABS: 1000.0000 mg | ORAL_TABLET | Freq: Four times a day (QID) | ORAL | 0 refills | Status: AC | PRN

## 2022-01-09 MED ORDER — IBUPROFEN 600 MG PO TABS: 600.0000 mg | ORAL_TABLET | Freq: Four times a day (QID) | ORAL | 0 refills | Status: AC | PRN

## 2022-01-09 NOTE — Progress Notes (Signed)
Patient discharged. Discharge instructions given. Patient verbalizes understanding. Transported by axillary. 

## 2022-01-18 NOTE — Addendum Note (Signed)
Addended by: Price Lachapelle on: 01/18/2022 03:27 PM   Modules accepted: Orders  

## 2022-01-23 ENCOUNTER — Telehealth: Payer: Self-pay

## 2022-01-23 ENCOUNTER — Encounter: Payer: Self-pay | Admitting: Obstetrics and Gynecology

## 2022-01-23 ENCOUNTER — Ambulatory Visit (INDEPENDENT_AMBULATORY_CARE_PROVIDER_SITE_OTHER): Payer: Self-pay | Admitting: Obstetrics and Gynecology

## 2022-01-23 VITALS — BP 130/76 | HR 88 | Resp 16 | Ht 61.0 in | Wt 163.2 lb

## 2022-01-23 DIAGNOSIS — Z98891 History of uterine scar from previous surgery: Secondary | ICD-10-CM

## 2022-01-23 DIAGNOSIS — Z8759 Personal history of other complications of pregnancy, childbirth and the puerperium: Secondary | ICD-10-CM

## 2022-01-23 DIAGNOSIS — Z4889 Encounter for other specified surgical aftercare: Secondary | ICD-10-CM

## 2022-01-23 NOTE — Progress Notes (Signed)
    OBSTETRICS/GYNECOLOGY POST-OPERATIVE CLINIC VISIT  Subjective:     Lori Smith is a 37 y.o. female who presents to the clinic 3 weeks status post  Cesarean Section  for  Cesarean Section, secondary to failure to progress and NRFHT, mild pre-eclampsia, chorioamnionitis.  Delivery was at [redacted] weeks gestation. Delivery was complicated by postpartum hemorrhage. Received 2 units PRBCs. Eating a regular diet without difficulty. Bowel movements are normal. Pain is controlled without any medications.  Currently breastfeeding infant without difficulty.  Bleeding is light.    The following portions of the patient's history were reviewed and updated as appropriate: allergies, current medications, past family history, past medical history, past social history, past surgical history, and problem list.  Review of Systems Pertinent items noted in HPI and remainder of comprehensive ROS otherwise negative.   Objective:   BP 130/76   Pulse 88   Resp 16   Ht 5\' 1"  (1.549 m)   Wt 163 lb 3.2 oz (74 kg)   BMI 30.84 kg/m  Body mass index is 30.84 kg/m.  General:  alert and no distress  Abdomen: soft, bowel sounds active, non-tender  Incision:   healing well, no drainage, no erythema, no hernia, no seroma, no swelling, no dehiscence, incision well approximated      Pathology:  None   Labs:  Lab Results  Component Value Date   WBC 21.0 (H) 01/06/2022   HGB 9.9 (L) 01/06/2022   HCT 28.8 (L) 01/06/2022   MCV 87.8 01/06/2022   PLT 206 01/06/2022     Assessment:   1. Postoperative visit   2. S/P cesarean section   3. History of pre-eclampsia   4. History of postpartum hemorrhage     Plan:   1. Continue any current medications as instructed by provider. 2. Wound care discussed. Overall doing well post-operatively.  3. Activity restrictions: no bending, stooping, or squatting, no lifting more than 10-15 pounds, and pelvic rest x 3 weeks 4. Will need to check Hgb at final  postpartum visit.  Taking iron as prescribed. Received 2 units PRBCs post-operatively.  5. BPs wnl. Had h/o mild pre-eclampsia diagnosed during labor.  6. Anticipated return to work: 2-3 weeks if applicable. 7. Follow up: with [facility] for final postpartum visit.     Rubie Maid, MD [place]

## 2022-01-23 NOTE — Telephone Encounter (Signed)
[facility]- Discharge Call Backs-Interpreter left voice mail for this patient with the following below. 1-Do you have any questions or concerns about yourself as you heal? 2-Any concerns or questions about your baby? Review ABC's of safe sleep. 3-How was your stay at the hospital? 4-How did our team work together to care for you? You should be receiving a survey in the mail soon.   We would really appreciate it if you could fill that out for [place] and return it in the mail.  We value the feedback to make improvements and continue the great work we do.   If you have any questions please feel free to call me back at [phone]

## 2022-02-27 ENCOUNTER — Encounter: Payer: Self-pay | Admitting: Advanced Practice Midwife

## 2022-02-27 ENCOUNTER — Ambulatory Visit: Payer: Self-pay | Admitting: Advanced Practice Midwife

## 2022-02-27 DIAGNOSIS — Z30013 Encounter for initial prescription of injectable contraceptive: Secondary | ICD-10-CM

## 2022-02-27 LAB — HM HIV SCREENING LAB: HM HIV Screening: NEGATIVE

## 2022-02-27 LAB — HEMOGLOBIN, FINGERSTICK: Hemoglobin: 12.5 g/dL (ref 11.1–15.9)

## 2022-02-27 MED ORDER — MEDROXYPROGESTERONE ACETATE 150 MG/ML IM SUSP
150.0000 mg | INTRAMUSCULAR | Status: AC
  Administered 2022-02-27 – 2022-11-07 (×4): 150 mg via INTRAMUSCULAR

## 2022-02-27 MED ORDER — PRENATAL MULTIVITAMIN CH: 1.0000 | ORAL_TABLET | Freq: Every day | ORAL | 0 refills | Status: AC

## 2022-02-27 NOTE — Progress Notes (Signed)
Pt here for PP follow up care at [facility]. Pt desires Depo for [facility], given left deltoid, tolerated well. Gave pt appointment reminder card for when to schedule for next Depo. Prenatal vitamins given this visit.  Hgb: 12.5; no treatment indicated.    Harland Dingwall, RN

## 2022-02-27 NOTE — Progress Notes (Signed)
[place]  Postpartum Exam  Lori Smith is a 37 y.o. SHF nonsmoker G2P2002 (43 yo daughter) female who presents for a postpartum visit. She is 7 weeks postpartum following a primary cesarean section on 01/04/22 after IOL for preeclampsia.   I have fully reviewed the prenatal and intrapartum course. The delivery was at 51 6/7 gestational weeks via LTCS for  FTP and arrest with chorio and pp hemorrhage >1,000 cc and 2UPRBC and ileus. F 7#7. Anesthesia: epidural. Postpartum course has been wnl. Baby is doing well. Baby is feeding by both breast and bottle - Carnation Good Start. Baby nursing q 2 hours and occassionally followed by 2-3 oz formula. Baby weighed 10 1/2 lbs at peds apt on 02/07/22. Her daughter and 84 yo FOB is helping her with baby. She denies pp coitus, no more lochia. Denies cigs, vaping, cigars, MJ. Last ETOH 2023 (3 Margaritas). Last dental apt 2 1/2 years ago. Plans to go back to work 04/2022. Living with daughter and baby.  Bleeding no bleeding. Bowel function is normal. Bladder function is normal. Patient is not sexually active. Contraception method is abstinence. Postpartum depression screening: negative. Last pap 09/21/19 neg HPV neg   The pregnancy intention screening data noted above was reviewed. Potential methods of contraception were discussed. The patient elected to proceed with Hormonal Injection.    Health Maintenance Due  Topic Date Due   COVID-19 Vaccine (1) Never done    The following portions of the patient's history were reviewed and updated as appropriate: allergies, current medications, past family history, past medical history, past social history, past surgical history, and problem list.  Review of Systems Pertinent items are noted in HPI.  Objective:  BP 117/73   Pulse 93   Temp (!) 97.3 F (36.3 C)   Wt 160 lb 3.2 oz (72.7 kg)   Breastfeeding Yes   BMI 30.27 kg/m    General:  alert   Breasts:  normal  Lungs: clear to  auscultation bilaterally  Heart:  regular rate and rhythm, S1, S2 normal, no murmur, click, rub or gallop  Abdomen: soft, non-tender; bowel sounds normal; no masses,  no organomegaly   Wound well approximated incision  GU exam:  normal       Assessment:    1. Postpartum exam Please give dental list to pt and encourage exam DMPA 150 mg IM q 11-13 wks x 1 year Please counsel on need for abstinance next 7 days   7 wk 5 days postpartum exam.   Plan:   Essential components of care per ACOG recommendations:  1.  Mood and well being: Patient with negative depression screening today. Reviewed local resources for support.  - Patient tobacco use? No.   - hx of drug use? No.    2. Infant care and feeding:  -Patient currently breastmilk feeding? Yes. Discussed returning to work and pumping. Reviewed importance of draining breast regularly to support lactation.  -Social determinants of health (SDOH) reviewed in EPIC. No concernsThe following needs were identified anemia  3. Sexuality, contraception and birth spacing - Patient does not want a pregnancy in the next year.  Desired family size is 2 children.  - Reviewed reproductive life planning. Reviewed options based on patient desire and reproductive life plan. Patient is interested in Hormonal Injection. This was provided to the patient today.  if not why not clearly documented  Risks, benefits, and typical effectiveness rates were reviewed.  Questions were answered.  Written information was  also given to the patient to review.    The patient will follow up in  12 weeks for surveillance.  The patient was told to call with any further questions, or with any concerns about this method of contraception.  Emphasized use of condoms 100% of the time for STI prevention.  Patient was offered ECP based on not meeting criteria.  Patient is within 0 days of unprotected sex. Patient was offered ECP. Reviewed options and patient desired No method of ECP,  declined all    - Discussed birth spacing of 18 months  4. Sleep and fatigue -Encouraged family/partner/community support of 4 hrs of uninterrupted sleep to help with mood and fatigue  5. Physical Recovery  - Discussed patients delivery and complications. She describes her labor as bad. - Patient had a C-section failure to progress. Patient had a  0  laceration. Perineal healing reviewed. Patient expressed understanding - Patient has urinary incontinence? No. - Patient is safe to resume physical and sexual activity  6.  Health Maintenance - HM due items addressed Yes - Last pap smear No results found for: "DIAGPAP" Pap smear not done at today's visit.  -Breast Cancer screening indicated? No.   7. Chronic Disease/Pregnancy Condition follow up: Anemia  - PCP follow up  Herbie Saxon, [place] [facility]

## 2022-03-18 NOTE — Addendum Note (Signed)
Addended by: Cletis [employer] on: 03/18/2022 04:02 PM   Modules accepted: Orders

## 2022-05-15 ENCOUNTER — Ambulatory Visit (LOCAL_COMMUNITY_HEALTH_CENTER): Payer: Self-pay

## 2022-05-15 VITALS — BP 110/74 | Ht 61.0 in | Wt 164.0 lb

## 2022-05-15 DIAGNOSIS — Z3042 Encounter for surveillance of injectable contraceptive: Secondary | ICD-10-CM

## 2022-05-15 DIAGNOSIS — Z3009 Encounter for other general counseling and advice on contraception: Secondary | ICD-10-CM

## 2022-05-15 NOTE — Progress Notes (Signed)
11 weeks 0 days post depo. Voices no concerns. Depo given today per order by Hazle Coca, CNM dated 02/27/22. Tolerated well R Delt. Next depo due 07/31/22. Pt has reminder. Lang line # [id number], interpreter today. Jerel Shepherd, RN

## 2022-08-21 ENCOUNTER — Ambulatory Visit (LOCAL_COMMUNITY_HEALTH_CENTER): Payer: Self-pay

## 2022-08-21 VITALS — BP 110/71 | Ht 61.0 in | Wt 165.0 lb

## 2022-08-21 DIAGNOSIS — Z3042 Encounter for surveillance of injectable contraceptive: Secondary | ICD-10-CM

## 2022-08-21 DIAGNOSIS — Z3009 Encounter for other general counseling and advice on contraception: Secondary | ICD-10-CM

## 2022-08-21 DIAGNOSIS — Z30013 Encounter for initial prescription of injectable contraceptive: Secondary | ICD-10-CM

## 2022-08-21 NOTE — Progress Notes (Signed)
14 weeks 0 days post depo. Voices no concerns. [employer] [phone] served as interpreter today.   Depo given today per order by Hazle Coca, CNM dated 02/27/22. Tolerated well L delt. Next depo due 11/06/22, has reminder. Jerel Shepherd, RN

## 2022-11-07 ENCOUNTER — Ambulatory Visit (LOCAL_COMMUNITY_HEALTH_CENTER): Payer: Self-pay

## 2022-11-07 VITALS — BP 114/58 | Ht 61.0 in | Wt 165.5 lb

## 2022-11-07 DIAGNOSIS — Z3009 Encounter for other general counseling and advice on contraception: Secondary | ICD-10-CM

## 2022-11-07 DIAGNOSIS — Z3042 Encounter for surveillance of injectable contraceptive: Secondary | ICD-10-CM

## 2022-11-07 MED ORDER — PRENATAL 27-0.8 MG PO TABS: 1.0000 | ORAL_TABLET | Freq: Every day | ORAL | Status: AC

## 2022-11-07 NOTE — Progress Notes (Addendum)
11 weeks 1 day post depo. Voices no concerns. Patient asks for prenatal vitamins as she is currently breastfeeding.  Consult E Sciora, CNM who gives ok for RN to dispense prenatal vitamins today.   The patient was dispensed prenatal vitamins #100 today. I provided counseling today regarding the medication. We discussed the medication, the side effects and when to call clinic. Patient given the opportunity to ask questions. Questions answered.   Depo given today per order by Hazle Coca, CNM dated 02/27/2022. Tolerated well R Delt. Next depo due 01/23/2023, has reminder. Alex Gardener interpreter today. Jerel Shepherd, RN

## 2022-11-07 NOTE — Progress Notes (Signed)
11 weeks 1 day post depo. Voices no concerns. Patient asks for prenatal vitamins as she is currently breastfeeding.  Consult E Yuritzi Kamp, CNM who gives ok for RN to dispense prenatal vitamins today.   The patient was dispensed prenatal vitamins #100 today. I provided counseling today regarding the medication. We discussed the medication, the side effects and when to call clinic. Patient given the opportunity to ask questions. Questions answered.   Depo given today per order by Hazle Coca, CNM dated 02/27/2022. Tolerated well R Delt. Next depo due 01/23/2023, has reminder. Alex Gardener interpreter today. Jerel Shepherd, RN Consulted on the plan of care for this client.  I agree with the documented note and actions taken to provide care for this client.  Hazle Coca, CNM

## 2023-01-23 ENCOUNTER — Ambulatory Visit: Payer: Self-pay

## 2023-01-23 VITALS — BP 123/71 | Ht 61.0 in | Wt 162.0 lb

## 2023-01-23 DIAGNOSIS — Z3042 Encounter for surveillance of injectable contraceptive: Secondary | ICD-10-CM

## 2023-01-23 DIAGNOSIS — Z3009 Encounter for other general counseling and advice on contraception: Secondary | ICD-10-CM

## 2023-01-23 MED ORDER — MEDROXYPROGESTERONE ACETATE 150 MG/ML IM SUSP
150.0000 mg | Freq: Once | INTRAMUSCULAR | Status: AC
  Administered 2023-01-23: 150 mg via INTRAMUSCULAR

## 2023-01-23 NOTE — Progress Notes (Signed)
11 Weeks   0 Days since last Depo Interpreter: Lang line [phone]   Voices no concerns today.  Counseled to adhere to 11 to 13 week intervals between depo injections for optimal benefit.  Depo given today per order by Hazle Coca, CNM   dated 02/27/2022.  Tolerated well Left deltoid.  Annual PE due 03/01/2023.  Next depo due 04/10/2023 has reminder card.  Jerel Shepherd, RN

## 2023-04-21 ENCOUNTER — Ambulatory Visit: Payer: Self-pay | Admitting: Nurse Practitioner

## 2023-04-21 ENCOUNTER — Encounter: Payer: Self-pay | Admitting: Family Medicine

## 2023-04-21 VITALS — BP 115/73 | HR 78 | Wt 166.0 lb

## 2023-04-21 DIAGNOSIS — Z3009 Encounter for other general counseling and advice on contraception: Secondary | ICD-10-CM

## 2023-04-21 DIAGNOSIS — Z30013 Encounter for initial prescription of injectable contraceptive: Secondary | ICD-10-CM

## 2023-04-21 DIAGNOSIS — Z01419 Encounter for gynecological examination (general) (routine) without abnormal findings: Secondary | ICD-10-CM

## 2023-04-21 DIAGNOSIS — Z113 Encounter for screening for infections with a predominantly sexual mode of transmission: Secondary | ICD-10-CM

## 2023-04-21 LAB — WET PREP FOR TRICH, YEAST, CLUE
Trichomonas Exam: NEGATIVE
Yeast Exam: NEGATIVE

## 2023-04-21 LAB — HM HEPATITIS C SCREENING LAB: HM Hepatitis Screen: NEGATIVE

## 2023-04-21 LAB — HM HIV SCREENING LAB: HM HIV Screening: NEGATIVE

## 2023-04-21 MED ORDER — MEDROXYPROGESTERONE ACETATE 150 MG/ML IM SUSP
150.0000 mg | INTRAMUSCULAR | Status: AC
  Administered 2023-04-21 – 2023-12-19 (×4): 150 mg via INTRAMUSCULAR

## 2023-04-21 NOTE — Progress Notes (Signed)
 Patient is here for PE and contraception. FP packet and wet prep reviewed with the patient and required no treatment. Depo injection given at the Rt deltiod and pt tolerated well to injection. Condoms declined. Austine Lefort, RN.

## 2023-04-22 LAB — HGB A1C W/O EAG: Hgb A1c MFr Bld: 5.8 % — ABNORMAL HIGH (ref 4.8–5.6)

## 2023-04-25 ENCOUNTER — Encounter: Payer: Self-pay | Admitting: Nurse Practitioner

## 2023-04-25 NOTE — Progress Notes (Signed)
 Please contact patient to let her know her hgb A1c was elevated in the pre-diabetes range. It was not elevated last year, so criteria is not met to diagnose pre-diabetes with one abnormal reading. Please encourage patient to focus on lifestyle modifications including exercising 5 times per week at a moderate level for 30 minutes. Also encourage diet with fresh fruits, vegetables and water. Patient should cut back on sweetened beverages and foods, and foods high in carbohydrates like white bread, white rice, and pasta. Please have client follow-up with a PCP.  Thank you,  Leary Provencal L. Lennin Osmond, FNP-C

## 2023-04-28 ENCOUNTER — Encounter: Payer: Self-pay | Admitting: Nurse Practitioner

## 2023-04-28 NOTE — Progress Notes (Unsigned)
 [employer] HE[facility] DEPARTMENT [facility] [address] [place] [address] Main phone: [phone]  Family Planning Visit - Repeat Yearly Visit  Subjective:  Lori Smith is a 38 y.o. G2P2002  being seen today for an annual wellness visit and to discuss contraception options. The patient is currently using hormonal injection for pregnancy prevention. Patient does not want a pregnancy in the next year.   Patient reports they are looking for a method with the following characteristics:  High efficacy at preventing pregnancy Does not involve insertion  Discrete method Method that does not involve too much memory  Patient has the following medical problems:  Patient Active Problem List   Diagnosis Date Noted  . Chorioamnionitis 01/09/2022  . Ileus, postoperative (HCC) 01/07/2022  . Post-dates pregnancy 01/03/2022  . Mild pre-eclampsia 01/03/2022  . 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 Obesity affecting pregnancy BMI=30.9 06/12/2021  . Advanced maternal age in multigravida 38 yo 06/12/2021  . Supervision of high-risk pregnancy of elderly multigravida, first trimester 06/12/2021  . Advanced paternal age 42 06/12/2021  . Postpartum  hemorrhage >1,000 with 2UPRBC 06/12/2021  . Abnormal glucose 1 hour glucola=140 on 06/12/21 06/12/2021  .  Polycystic ovarian disease dx'd age 13 09/21/2019    Chief Complaint  Patient presents with  . Annual Exam    Pt is here PE and depo     HPI Patient is a pleasant 38 y.o. female who presents to the office today for annual well woman exam, and is requesting asymptomatic STI testing.  Patient reports concerns today include the following: Headaches but states she has always had them and these are nothing new. States her last headache was 2 days ago and when she has a headache it feels like a band around the head. She reports Tylenol  helps some.  Patient reports drinking "a lot" of water and gets 9  hours of sleep in total at night, but does wake up about 6 times per night to breastfeed (last delivery 2023).  Patient indicates 1 female partner in the last 2 months. She reports practicing vaginal sex and does not use condoms. Patient indicates no STI history. Patient reports last sex was 04/19/23 without a condom. She indicates use of hormonal injection as contraception method.  Patient indicates not having periods, but will spot lightly when hormonal injection is due.   Review of Systems  Constitutional:  Negative for weight loss.  HENT:  Negative for sore throat.   Eyes:  Negative for blurred vision.  Respiratory:  Negative for cough, shortness of breath and wheezing.   Cardiovascular:  Negative for chest pain and claudication.  Gastrointestinal:  Negative for nausea and vomiting.  Genitourinary:  Negative for dysuria and frequency.  Skin:  Negative for rash.  Neurological:  Positive for headaches. Negative for dizziness and seizures.  Endo/Heme/Allergies:  Does not bruise/bleed easily.    See flowsheet for other program required questions.   Diabetes screening This patient is 38 y.o. with a BMI of Body mass index is 31.37 kg/m.Aaron Aas  Is patient eligible for diabetes screening (age >35 and BMI >25)?  {YES/NO/NOT APPLICABLE:20182}  Was Hgb A1c ordered? {YES/NO/NOT APPLICABLE:20182}  STI screening Patient reports {NUMBER 1-10:22536} of partners in last year.  Does this patient desire STI screening?  {Yes or If no, why not?:20788}  Hepatitis C screening Has patient been screened once for HCV in the past?  {yes/no:20286}  No results found for: "HCVAB"  Does the patient meet criteria for  HCV testing? {yes/no:20286}  ***(If yes-- Screen for HCV through [facility]) Criteria:  Since the last HCV result, does the patient have any of the following? - Current drug use - Have a partner with drug use - Has been incarcerated  Hepatitis B screening Does the patient meet criteria for HBV  testing? {yes/no:20286} Criteria:  -Household, sexual or needle sharing contact with HBV -History of drug use -HIV positive -Those with known Hep C  Cervical Cancer Screening  Result Date Procedure Results Follow-ups  04/20/2020 HM PAP SMEAR HM Pap smear: negative for intraepithelial lesions or malignancy   09/21/2019 IGP, Aptima HPV DIAGNOSIS:: Comment Specimen adequacy:: Comment Clinician Provided ICD10: Comment Performed by:: Comment PAP Smear Comment: . Note:: Comment Test Methodology: Comment HPV Aptima: Negative   07/31/2015 HM PAP SMEAR HM Pap smear: Negative     Health Maintenance Due  Topic Date Due  . COVID-19 Vaccine (1 - 2024-25 season) Never done    The following portions of the patient's history were reviewed and updated as appropriate: allergies, current medications, past family history, past medical history, past social history, past surgical history and problem list. Problem list updated.  Objective:   Vitals:   04/21/23 1102  BP: 115/73  Pulse: 78  Weight: 166 lb (75.3 kg)    Physical Exam  Assessment and Plan:  Lori Smith is a 38 y.o. female G2P2002 presenting to the [facility] Department for an yearly wellness and contraception visit  Contraception counseling: Reviewed options based on patient desire and reproductive life plan. Patient is interested in {[employer] Methods:24109}. This {WAS/WAS NOT:[phone]::"was not"} provided to the patient today. *** if not why not clearly documented  Risks, benefits, and typical effectiveness rates were reviewed.  Questions were answered.  Written information was also given to the patient to review.    The patient will follow up in  {NUMBER 1-10:22536} {days/wks/mos/yrs:310907} for surveillance.  The patient was told to call with any further questions, or with any concerns about this method of contraception.  Emphasized use of condoms 100% of the time for STI prevention.  Educated on ECP and  assessed need for ECP. Patient was offered ECP based on {unprotected sex categories:26659}.  Patient is within {NUMBER 1-10:22536} days of unprotected sex. Patient was offered ECP. Reviewed options and patient desired {ECP options:27263}   1. Family planning (Primary) *** - medroxyPROGESTERone  (DEPO-PROVERA ) injection 150 mg  2. Well woman exam *** - Hgb A1c w/o eAG  3. Screening for venereal disease *** - Chlamydia/Gonorrhea [facility] - HIV/HCV [facility] - WET PREP FOR TRICH, YEAST, CLUE - Syphilis Serology, [facility]   Return in about 1 year (around 04/20/2024) for annual well-woman exam.  No future appointments.  Merleen Stare, NP

## 2023-05-07 IMAGING — US US OB COMP LESS 14 WK
1 series · 14 of 28 positions shown · non-contrast
Comparison: None Available.

CLINICAL DATA: Spotting

EXAM:
OBSTETRIC <14 WK ULTRASOUND
TECHNIQUE: Transabdominal ultrasound was performed for evaluation of the
gestation as well as the maternal uterus and adnexal regions.

[Series 1: us ob comp less 14 wks · 14 of 38 slices shown]
[im 2/38]
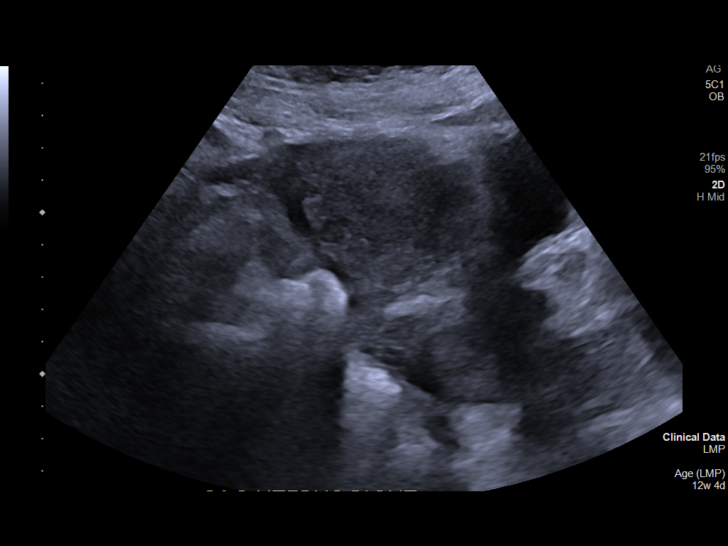
[im 5/38]
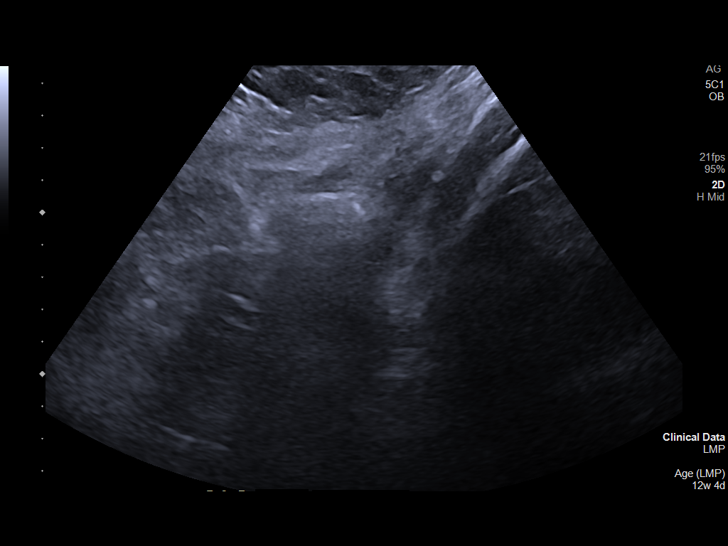
[im 7/38]
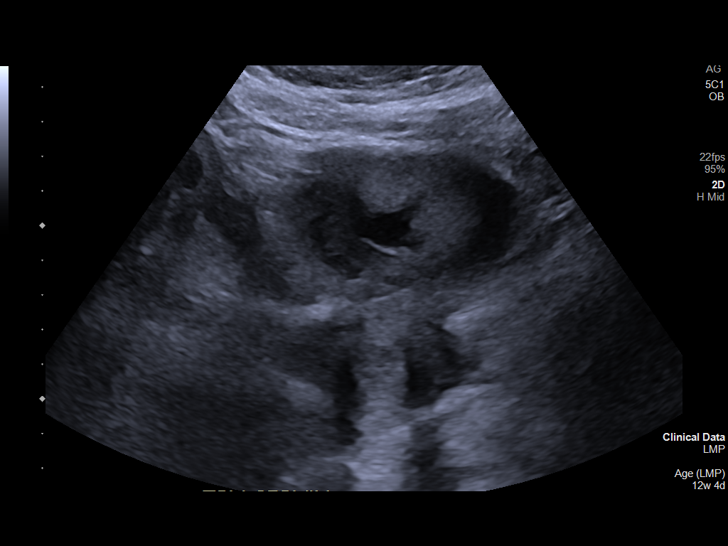
[im 10/38]
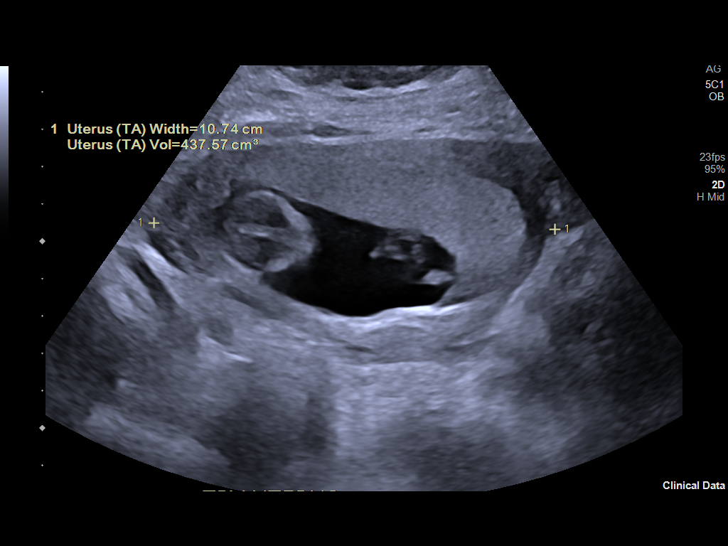
[im 13/38]
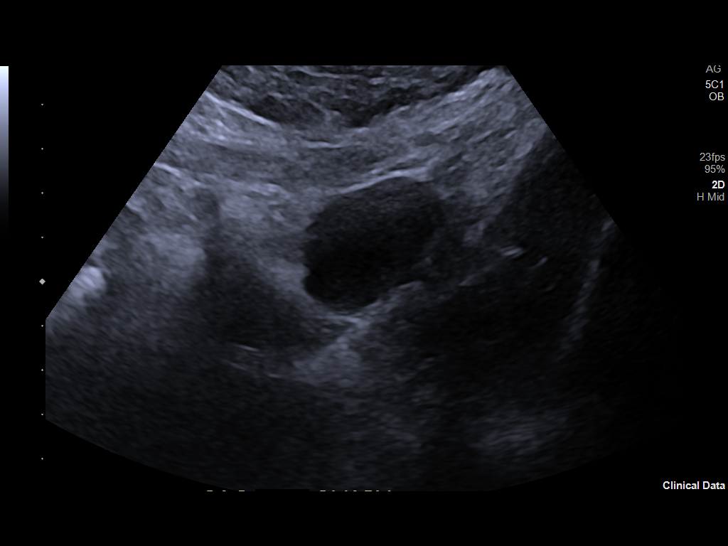
[im 16/38]
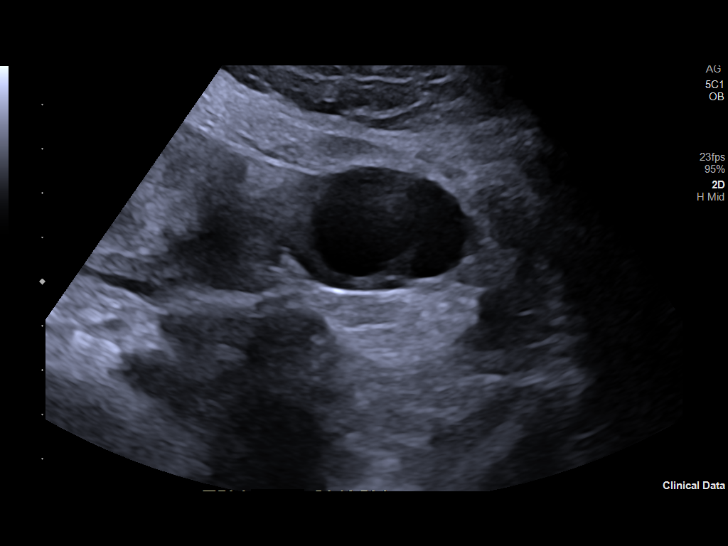
[im 18/38]
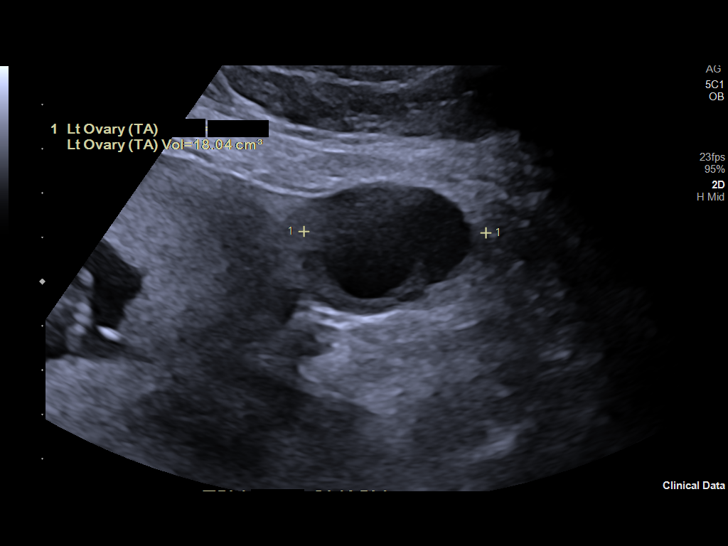
[im 21/38]
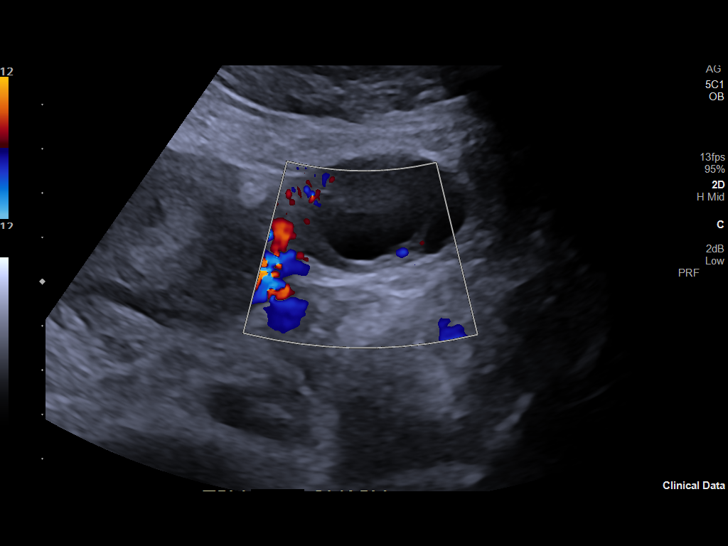
[im 24/38]
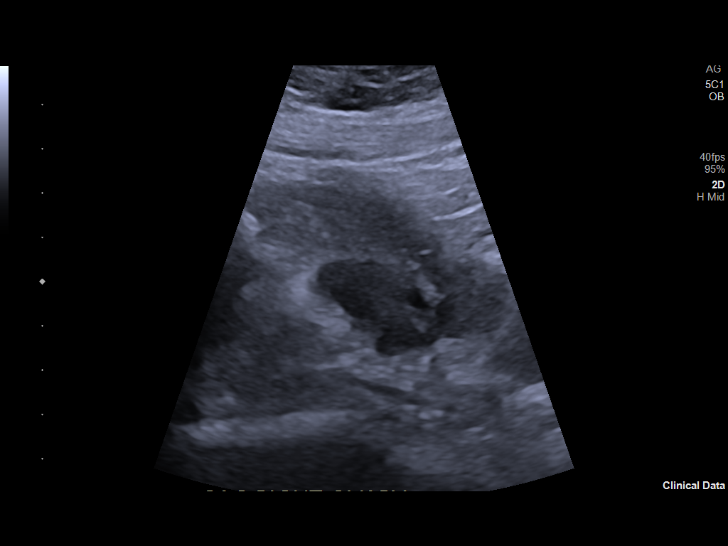
[im 27/38]
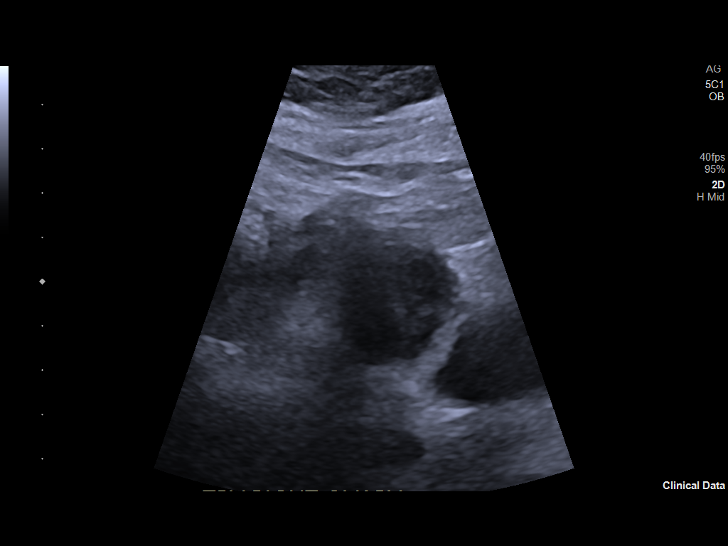
[im 29/38]
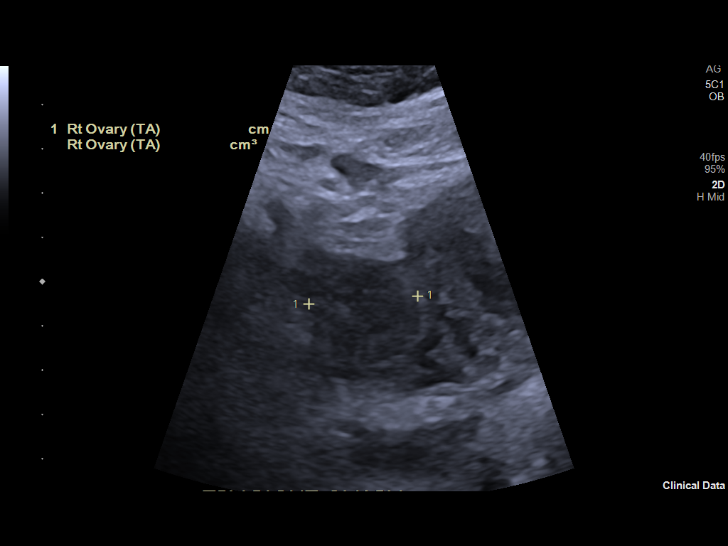
[im 32/38]
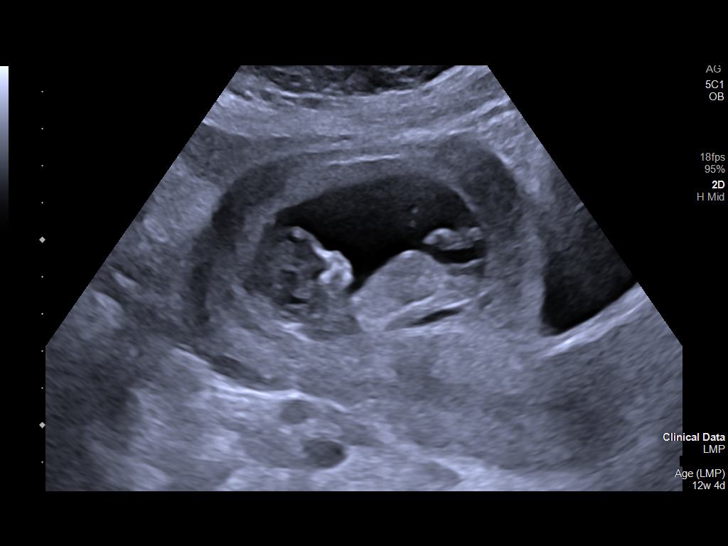
[im 35/38]
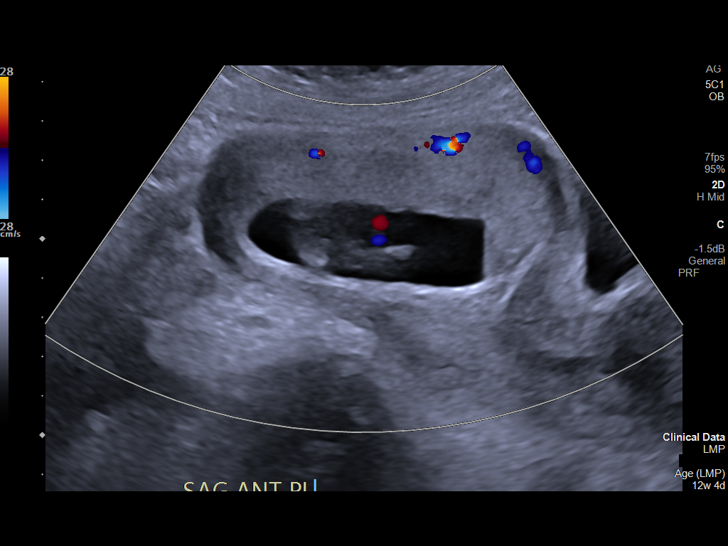
[im 38/38]
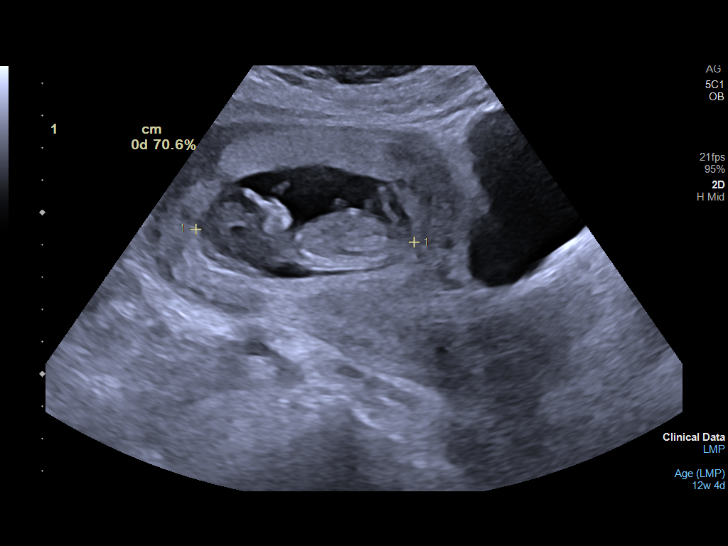

[14 of 28 positions shown; findings below may reference images not displayed]

FINDINGS: Intrauterine gestational sac: Single

Yolk sac:  No yolk sac visualized, anterior placenta present

Embryo:  Visualized

Cardiac Activity: Visualized

Heart Rate: 153 bpm

CRL: 67 mm   13 w 0 d                  US EDC: 12/26/2021

Subchorionic hemorrhage:  None visualized.

Maternal uterus/adnexae: The bilateral ovaries are normal. No free
fluid.
IMPRESSION: Single viable intrauterine pregnancy. Estimated gestational age 13
weeks 0 days.

This exam is performed on an emergent basis and does not
comprehensively evaluate fetal size, dating, or anatomy; follow-up
complete OB US should be considered if further fetal assessment is
warranted.

## 2023-05-07 IMAGING — US US RENAL
1 series · 14 of 25 positions shown · non-contrast
Comparison: None Available.

CLINICAL DATA: Right-sided pain

EXAM:
RENAL / URINARY TRACT ULTRASOUND COMPLETE

[Series 1: us renal · 14 of 53 slices shown]
[im 1/53]
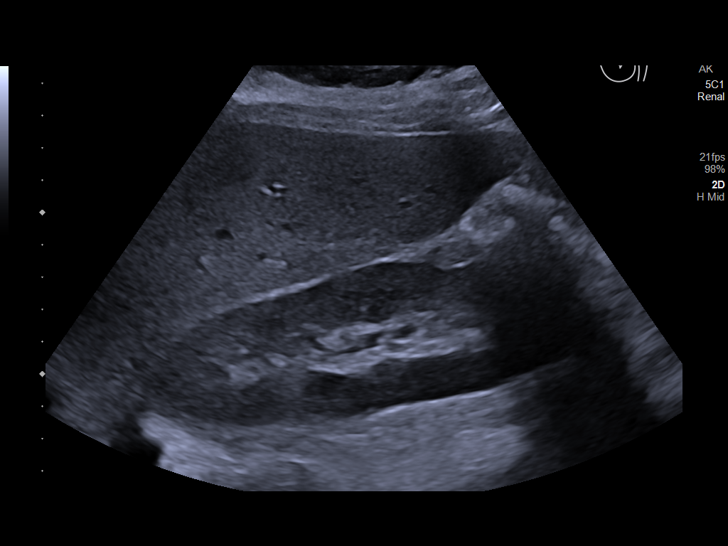
[im 5/53]
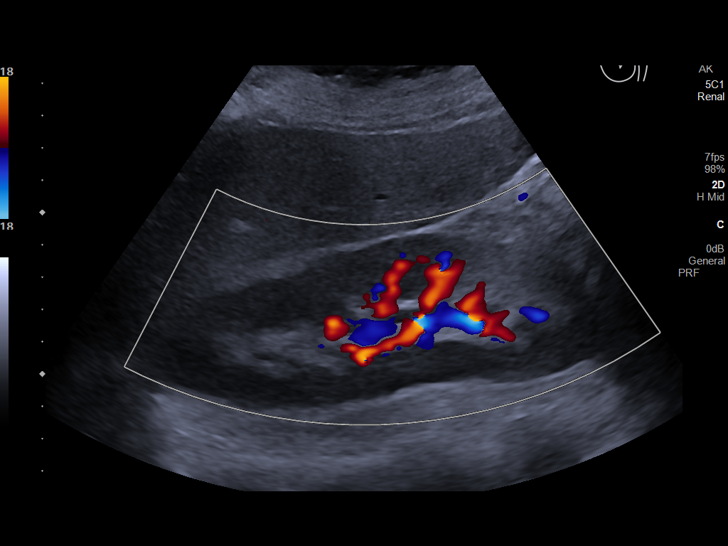
[im 9/53]
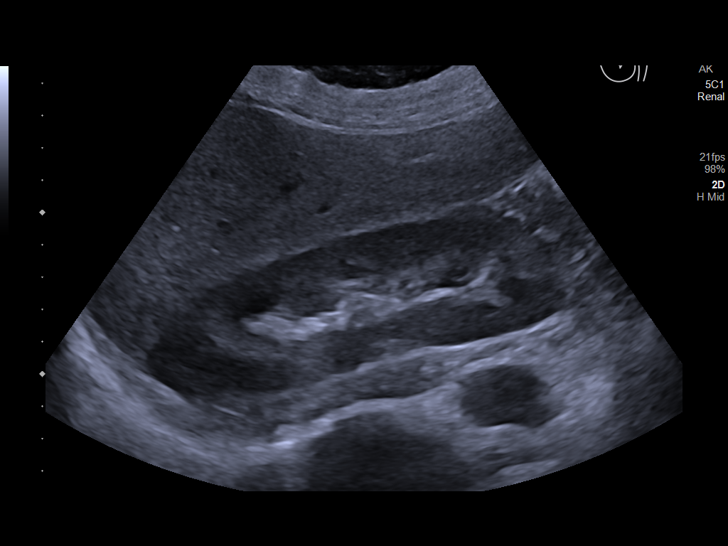
[im 14/53]
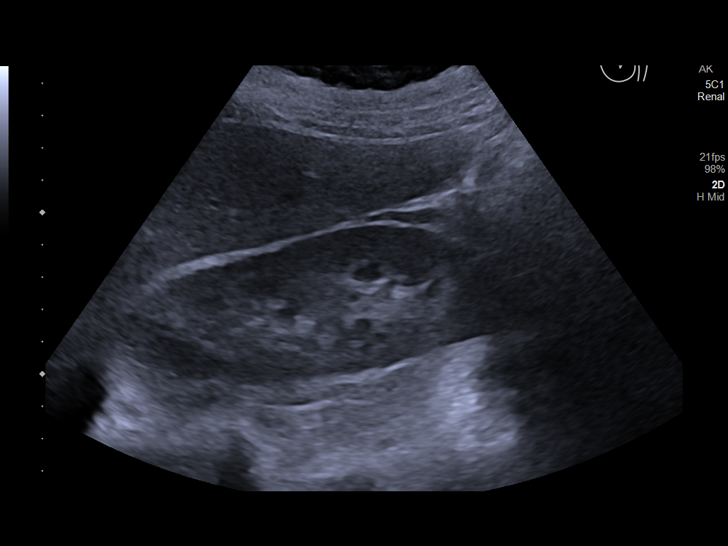
[im 18/53]
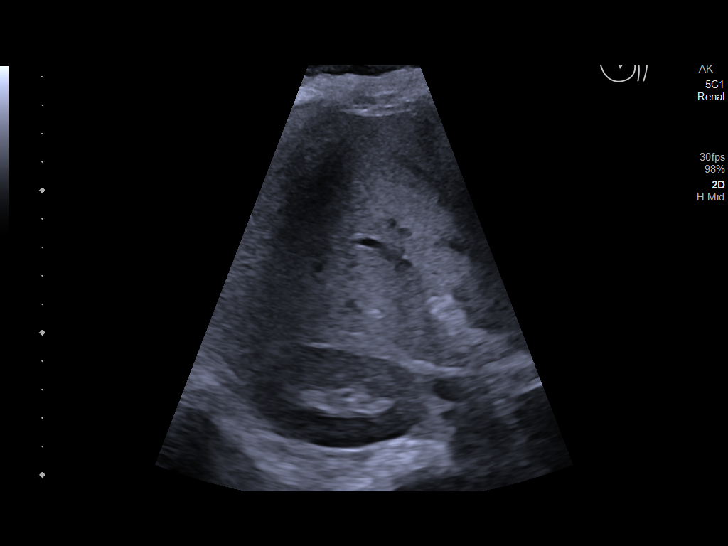
[im 20/53]
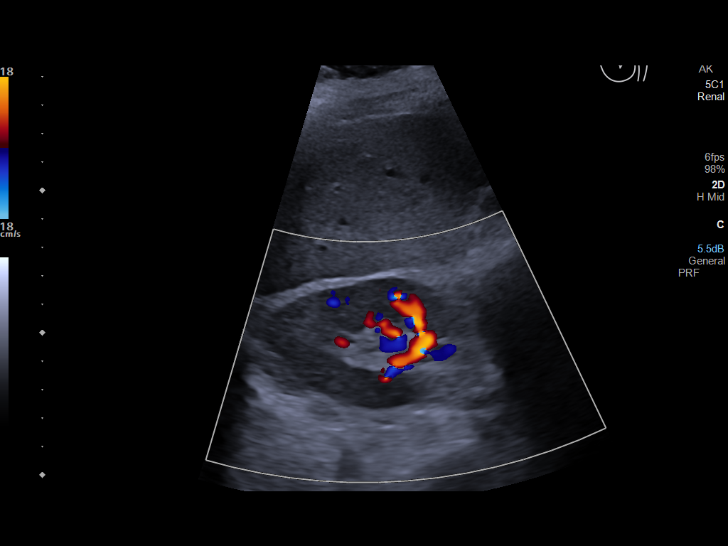
[im 24/53]
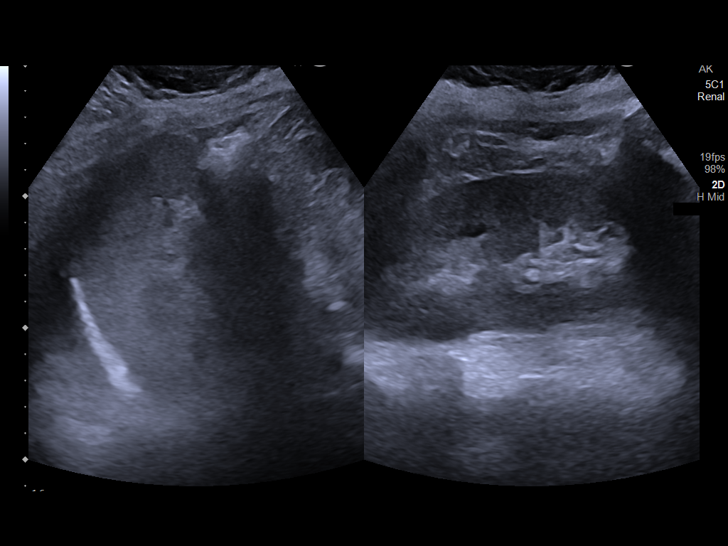
[im 29/53]
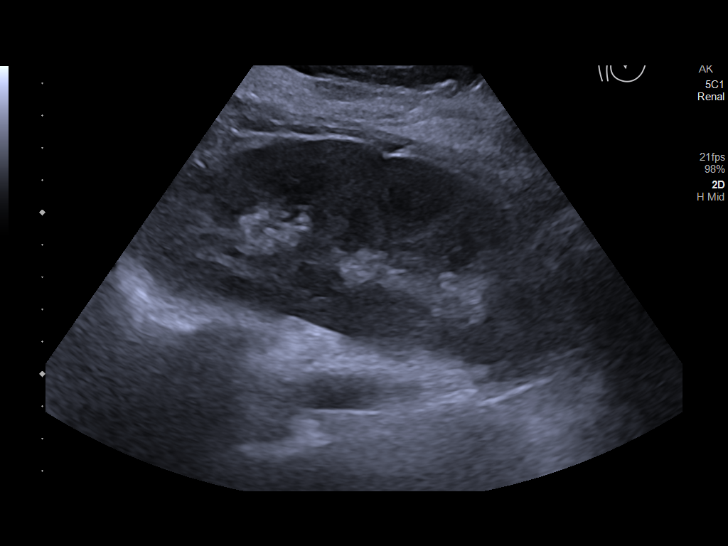
[im 33/53]
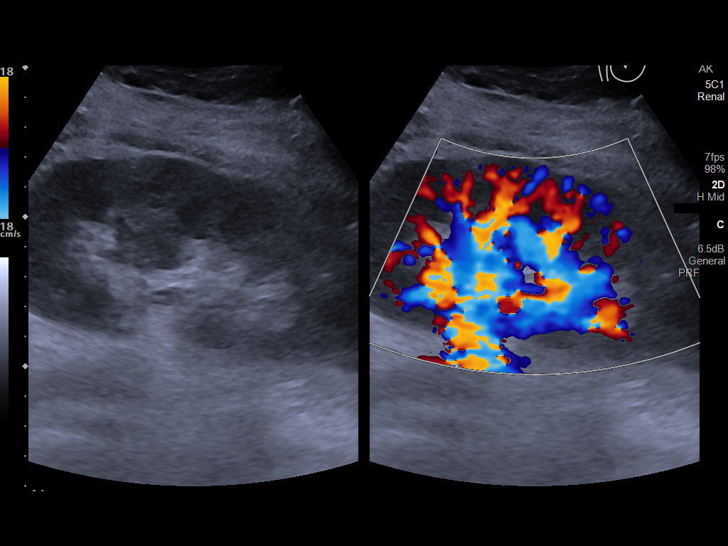
[im 35/53]
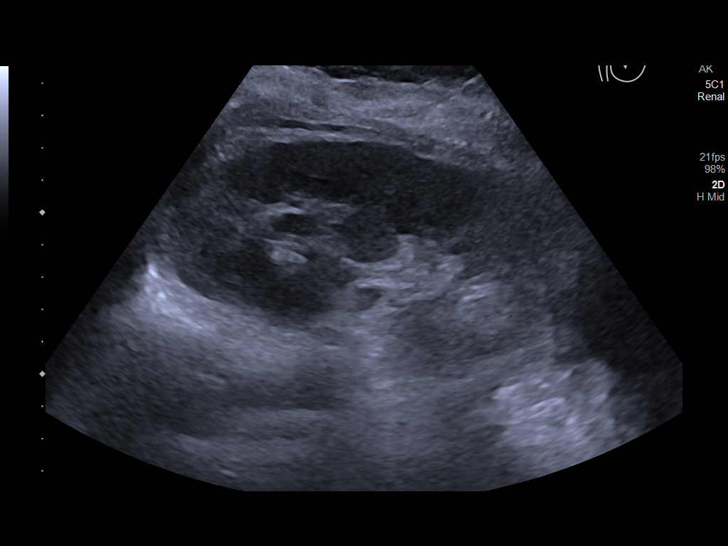
[im 40/53]
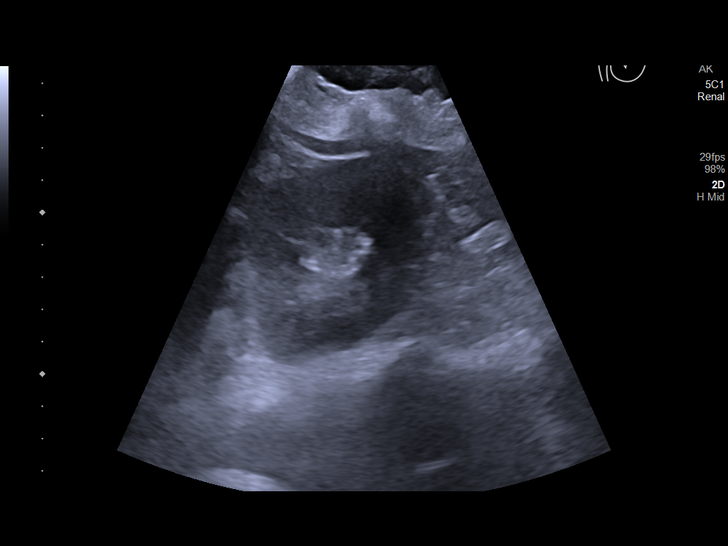
[im 44/53]
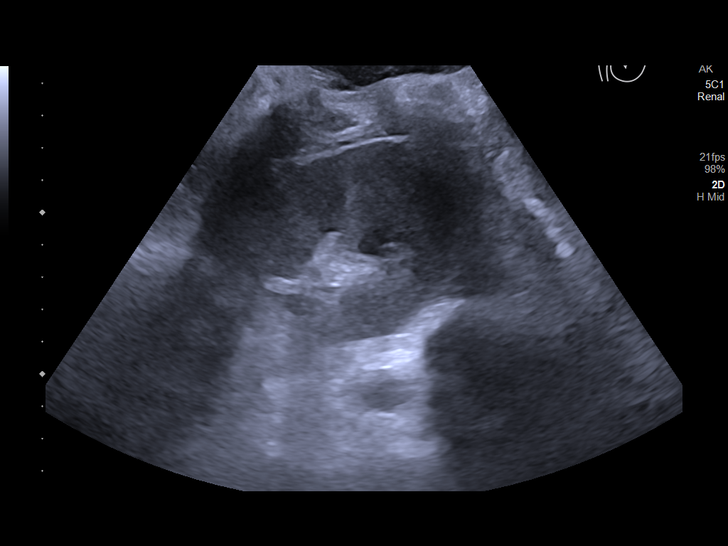
[im 48/53]
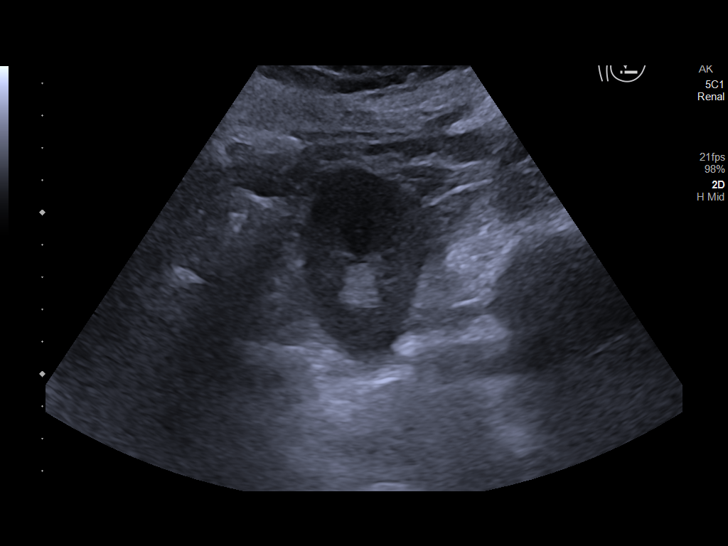
[im 53/53]
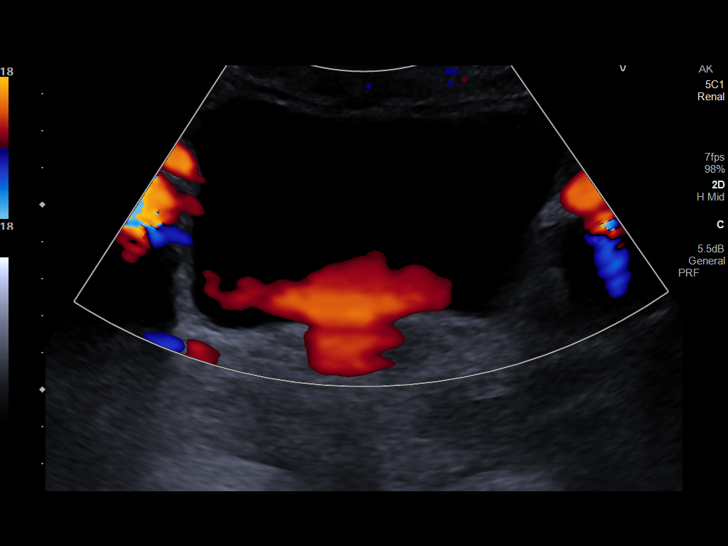

[14 of 25 positions shown; findings below may reference images not displayed]

FINDINGS: Right Kidney:

Renal measurements: 13 x 3.9 x 5.9 = volume: 157.8 mL. Echogenicity
within normal limits. No mass or hydronephrosis visualized.

Left Kidney:

Renal measurements: 12 x 6 x 6 cm = volume: 225.4 mL. Echogenicity
within normal limits. No mass or hydronephrosis visualized.

Bladder:

Appears normal for degree of bladder distention.

Other:

None.
IMPRESSION: Negative.  No hydronephrosis.

## 2023-07-15 ENCOUNTER — Ambulatory Visit: Payer: Self-pay

## 2023-07-15 VITALS — BP 109/65 | Ht 61.0 in | Wt 173.0 lb

## 2023-07-15 DIAGNOSIS — Z3009 Encounter for other general counseling and advice on contraception: Secondary | ICD-10-CM

## 2023-07-15 DIAGNOSIS — Z3042 Encounter for surveillance of injectable contraceptive: Secondary | ICD-10-CM

## 2023-07-15 NOTE — Progress Notes (Signed)
 12 weeks and 1 day post depo. Voices no concerns. Depo given today per order by JINNY Narrow, NP dated 04/21/2023. Tolerated well in L deltoid. Next depo due 09/30/2023; patient aware.  Interpreter, [place]ley [phone], helped during this visit.  Doyce CINDERELLA Shuck, RN

## 2023-10-02 ENCOUNTER — Ambulatory Visit: Payer: Self-pay

## 2023-10-02 VITALS — BP 125/65 | HR 84 | Ht 61.0 in | Wt 177.5 lb

## 2023-10-02 DIAGNOSIS — Z3042 Encounter for surveillance of injectable contraceptive: Secondary | ICD-10-CM

## 2023-10-02 DIAGNOSIS — Z3009 Encounter for other general counseling and advice on contraception: Secondary | ICD-10-CM

## 2023-10-02 DIAGNOSIS — Z30013 Encounter for initial prescription of injectable contraceptive: Secondary | ICD-10-CM

## 2023-10-02 NOTE — Progress Notes (Signed)
 11 weeks, 2 days since last Depo Voices no concern.  Depo given per order by Clarita Narrow, FNP Order dated 04/21/2023  Tolerated well in right deltoid. Next depo due 12/18/2023  Visit translated by Ivan Redder.  Lori KATHEE Glatter, RN .

## 2023-12-19 ENCOUNTER — Ambulatory Visit: Payer: Self-pay

## 2023-12-19 VITALS — BP 112/70 | Ht 61.0 in | Wt 181.5 lb

## 2023-12-19 DIAGNOSIS — Z3009 Encounter for other general counseling and advice on contraception: Secondary | ICD-10-CM

## 2023-12-19 DIAGNOSIS — Z3042 Encounter for surveillance of injectable contraceptive: Secondary | ICD-10-CM

## 2023-12-19 DIAGNOSIS — Z30013 Encounter for initial prescription of injectable contraceptive: Secondary | ICD-10-CM

## 2023-12-19 NOTE — Progress Notes (Signed)
 11weeks 1 day post last depo. Interpreter used : [employer] [id number]. Voices no concerns.  Depo given today  per order J. Idol, NP dated 04/21/23. Depo given IM L. Delt today. Tolerated well. Next depo due:03/05/24 Counseled pt on adhering to 11-13wk interval for injections.
